# Patient Record
Sex: Female | Born: 1940
Health system: Southern US, Community
[De-identification: ages and names within clinical notes are randomized; demographics above are authoritative.]

## PROBLEM LIST (undated history)

## (undated) DIAGNOSIS — E7800 Pure hypercholesterolemia, unspecified: Secondary | ICD-10-CM

## (undated) DIAGNOSIS — M8580 Other specified disorders of bone density and structure, unspecified site: Secondary | ICD-10-CM

## (undated) DIAGNOSIS — T7840XA Allergy, unspecified, initial encounter: Secondary | ICD-10-CM

## (undated) DIAGNOSIS — I10 Essential (primary) hypertension: Secondary | ICD-10-CM

## (undated) DIAGNOSIS — M1990 Unspecified osteoarthritis, unspecified site: Secondary | ICD-10-CM

## (undated) DIAGNOSIS — J189 Pneumonia, unspecified organism: Secondary | ICD-10-CM

## (undated) HISTORY — DX: Essential (primary) hypertension: I10

## (undated) HISTORY — DX: Allergy, unspecified, initial encounter: T78.40XA

## (undated) HISTORY — DX: Other specified disorders of bone density and structure, unspecified site: M85.80

## (undated) HISTORY — PX: FRACTURE SURGERY: SHX138

---

## 1974-03-02 HISTORY — PX: ABDOMINAL HYSTERECTOMY: SHX81

## 1995-05-01 ENCOUNTER — Encounter (INDEPENDENT_AMBULATORY_CARE_PROVIDER_SITE_OTHER): Payer: Self-pay | Admitting: *Deleted

## 1995-05-01 LAB — CONVERTED CEMR LAB

## 1997-07-01 ENCOUNTER — Encounter: Admission: RE | Admit: 1997-07-01 | Discharge: 1997-07-01 | Payer: Self-pay | Admitting: Family Medicine

## 1997-12-01 ENCOUNTER — Encounter: Admission: RE | Admit: 1997-12-01 | Discharge: 1997-12-01 | Payer: Self-pay | Admitting: Family Medicine

## 1997-12-16 ENCOUNTER — Encounter: Admission: RE | Admit: 1997-12-16 | Discharge: 1997-12-16 | Payer: Self-pay | Admitting: Family Medicine

## 1998-01-19 ENCOUNTER — Encounter: Admission: RE | Admit: 1998-01-19 | Discharge: 1998-01-19 | Payer: Self-pay | Admitting: Sports Medicine

## 1998-04-13 ENCOUNTER — Encounter: Admission: RE | Admit: 1998-04-13 | Discharge: 1998-04-13 | Payer: Self-pay | Admitting: Sports Medicine

## 1998-08-19 ENCOUNTER — Encounter: Admission: RE | Admit: 1998-08-19 | Discharge: 1998-08-19 | Payer: Self-pay | Admitting: Family Medicine

## 1998-09-22 ENCOUNTER — Encounter: Admission: RE | Admit: 1998-09-22 | Discharge: 1998-09-22 | Payer: Self-pay | Admitting: Family Medicine

## 1999-01-20 ENCOUNTER — Encounter: Admission: RE | Admit: 1999-01-20 | Discharge: 1999-01-20 | Payer: Self-pay | Admitting: Sports Medicine

## 1999-02-02 ENCOUNTER — Encounter: Admission: RE | Admit: 1999-02-02 | Discharge: 1999-02-02 | Payer: Self-pay | Admitting: Family Medicine

## 1999-07-06 ENCOUNTER — Encounter: Admission: RE | Admit: 1999-07-06 | Discharge: 1999-07-06 | Payer: Self-pay | Admitting: Sports Medicine

## 1999-10-05 ENCOUNTER — Encounter: Admission: RE | Admit: 1999-10-05 | Discharge: 1999-10-05 | Payer: Self-pay | Admitting: Family Medicine

## 1999-12-07 ENCOUNTER — Encounter: Admission: RE | Admit: 1999-12-07 | Discharge: 1999-12-07 | Payer: Self-pay | Admitting: Family Medicine

## 2000-02-16 ENCOUNTER — Encounter: Admission: RE | Admit: 2000-02-16 | Discharge: 2000-02-16 | Payer: Self-pay | Admitting: Family Medicine

## 2000-06-06 ENCOUNTER — Encounter: Admission: RE | Admit: 2000-06-06 | Discharge: 2000-06-06 | Payer: Self-pay | Admitting: Family Medicine

## 2000-07-05 ENCOUNTER — Encounter: Admission: RE | Admit: 2000-07-05 | Discharge: 2000-07-05 | Payer: Self-pay | Admitting: Family Medicine

## 2000-08-09 ENCOUNTER — Encounter: Admission: RE | Admit: 2000-08-09 | Discharge: 2000-08-09 | Payer: Self-pay | Admitting: Family Medicine

## 2000-09-20 ENCOUNTER — Encounter: Admission: RE | Admit: 2000-09-20 | Discharge: 2000-09-20 | Payer: Self-pay | Admitting: Family Medicine

## 2000-10-05 ENCOUNTER — Encounter: Admission: RE | Admit: 2000-10-05 | Discharge: 2000-10-05 | Payer: Self-pay | Admitting: *Deleted

## 2000-10-05 ENCOUNTER — Encounter: Payer: Self-pay | Admitting: Sports Medicine

## 2000-10-29 ENCOUNTER — Encounter: Admission: RE | Admit: 2000-10-29 | Discharge: 2000-10-29 | Payer: Self-pay | Admitting: Family Medicine

## 2000-10-29 ENCOUNTER — Encounter: Payer: Self-pay | Admitting: Family Medicine

## 2000-11-01 ENCOUNTER — Encounter: Admission: RE | Admit: 2000-11-01 | Discharge: 2000-11-01 | Payer: Self-pay | Admitting: Family Medicine

## 2000-11-12 ENCOUNTER — Encounter: Admission: RE | Admit: 2000-11-12 | Discharge: 2000-11-12 | Payer: Self-pay | Admitting: Family Medicine

## 2002-09-17 ENCOUNTER — Ambulatory Visit (HOSPITAL_COMMUNITY): Admission: RE | Admit: 2002-09-17 | Discharge: 2002-09-17 | Payer: Self-pay | Admitting: Family Medicine

## 2003-10-06 ENCOUNTER — Ambulatory Visit: Payer: Self-pay | Admitting: *Deleted

## 2003-11-10 ENCOUNTER — Ambulatory Visit (HOSPITAL_COMMUNITY): Admission: RE | Admit: 2003-11-10 | Discharge: 2003-11-10 | Payer: Self-pay | Admitting: Family Medicine

## 2004-03-17 ENCOUNTER — Ambulatory Visit: Payer: Self-pay | Admitting: Family Medicine

## 2006-03-30 ENCOUNTER — Encounter (INDEPENDENT_AMBULATORY_CARE_PROVIDER_SITE_OTHER): Payer: Self-pay | Admitting: *Deleted

## 2006-11-24 ENCOUNTER — Emergency Department (HOSPITAL_COMMUNITY): Admission: EM | Admit: 2006-11-24 | Discharge: 2006-11-24 | Payer: Self-pay | Admitting: Family Medicine

## 2011-03-26 ENCOUNTER — Ambulatory Visit (INDEPENDENT_AMBULATORY_CARE_PROVIDER_SITE_OTHER): Payer: Medicare Other | Admitting: Family Medicine

## 2011-03-26 DIAGNOSIS — J45909 Unspecified asthma, uncomplicated: Secondary | ICD-10-CM | POA: Insufficient documentation

## 2011-03-26 DIAGNOSIS — I10 Essential (primary) hypertension: Secondary | ICD-10-CM | POA: Insufficient documentation

## 2011-03-26 DIAGNOSIS — E7800 Pure hypercholesterolemia, unspecified: Secondary | ICD-10-CM

## 2011-03-26 DIAGNOSIS — J309 Allergic rhinitis, unspecified: Secondary | ICD-10-CM

## 2011-03-26 MED ORDER — FLUTICASONE PROPIONATE 50 MCG/ACT NA SUSP: 2.0000 | Freq: Every day | NASAL | Status: DC

## 2011-03-26 NOTE — Progress Notes (Signed)
  Patient Name: Kathryn Gomez Date of Birth: Oct 13, 1940 Medical Record Number: [id number] Gender: female Date of Encounter: 03/26/2011  History of Present Illness:  Kathryn Gomez is a 71 y.o. very pleasant female patient who presents with the following:  She has noticed that her nose is "dripping," eyes running and sneezing.  Nasal drip is her main problem.  No cough.  Symptoms for about 2 days.  Last night she felt like she might have a fever but felt better today.  No GI symptoms.  NO headache. Using atrovent nasal once a day.  Is taking claritin regular right now.   Wonders if she could try claritin D  Patient Active Problem List  Diagnoses  . High cholesterol  . Hypertension  . Asthma   No past medical history on file. No past surgical history on file. History  Substance Use Topics  . Smoking status: Never Smoker   . Smokeless tobacco: Not on file  . Alcohol Use: Not on file   No family history on file. No Known Allergies  Medication list has been reviewed and updated.  Review of Systems: As per HPI- otherwise negative.  Physical Examination: Filed Vitals:   03/26/11 1440  BP: 120/78  Pulse: 72  Temp: 98.2 F (36.8 C)  TempSrc: Oral  Resp: 18  Height: 5' 4.25" (1.632 m)  Weight: 155 lb (70.308 kg)    Body mass index is 26.40 kg/(m^2).  GEN: WDWN, NAD, Non-toxic, A & O x 3 HEENT: Atraumatic, Normocephalic. Neck supple. No masses, No LAD.  Tm wnl bilaterally, oropharynx wnl.  PEERL Ears and Nose: No external deformity. Nasal cavity wnl CV: RRR, No M/G/R. No JVD. No thrill. No extra heart sounds. PULM: CTA B, no wheezes, crackles, rhonchi. No retractions. No resp. distress. No accessory muscle use. EXTR: No c/c/e NEURO Normal gait.  PSYCH: Normally interactive. Conversant. Not depressed or anxious appearing.  Calm demeanor.   Assessment and Plan: 1. Allergic rhinitis  fluticasone (FLONASE) 50 MCG/ACT nasal spray  2. High cholesterol    3. Hypertension    4.  Asthma     Suspect allergic rhinitis.  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  Let us know if not better in a few days!  If she wishes is likely ok to try claritin D for a few days- may raise her BP a bit but she is very well controlled so should not be a problem.

## 2011-04-20 ENCOUNTER — Other Ambulatory Visit: Payer: Self-pay | Admitting: Emergency Medicine

## 2011-05-04 ENCOUNTER — Ambulatory Visit (INDEPENDENT_AMBULATORY_CARE_PROVIDER_SITE_OTHER): Payer: Medicare Other | Admitting: Family Medicine

## 2011-05-04 ENCOUNTER — Encounter: Payer: Self-pay | Admitting: Family Medicine

## 2011-05-04 VITALS — BP 145/84 | HR 81 | Temp 98.3°F | Resp 16 | Ht 64.5 in | Wt 154.0 lb

## 2011-05-04 DIAGNOSIS — Z5189 Encounter for other specified aftercare: Secondary | ICD-10-CM

## 2011-05-04 DIAGNOSIS — I10 Essential (primary) hypertension: Secondary | ICD-10-CM

## 2011-05-04 DIAGNOSIS — Z79899 Other long term (current) drug therapy: Secondary | ICD-10-CM

## 2011-05-04 MED ORDER — LISINOPRIL-HYDROCHLOROTHIAZIDE 20-12.5 MG PO TABS: 1.0000 | ORAL_TABLET | Freq: Every day | ORAL | Status: DC

## 2011-05-04 MED ORDER — AMLODIPINE BESYLATE 5 MG PO TABS: 5.0000 mg | ORAL_TABLET | Freq: Every day | ORAL | Status: DC

## 2011-05-04 MED ORDER — SIMVASTATIN 20 MG PO TABS: 20.0000 mg | ORAL_TABLET | Freq: Every evening | ORAL | Status: DC

## 2011-05-04 NOTE — Progress Notes (Signed)
  Subjective:    Patient ID: San Jetty, female    DOB: 1940-10-09, 71 y.o.   MRN: [id number]  HPI  This 71 y.o female has HTN and Hyperlipidemia and needs medication refills. She denies any  side effects with the medications, She is considering cataract surgery in the near future and then plans to  return for complete physical exam. She would like labs done st that time.    Review of Systems Noncontributory     Objective:   Physical Exam  Vitals reviewed. Constitutional: She is oriented to person, place, and time. She appears well-developed and well-nourished. No distress.  HENT:  Head: Normocephalic and atraumatic.  Eyes: No scleral icterus.  Neck: Normal range of motion.  Cardiovascular: Normal rate and regular rhythm.   Pulmonary/Chest: Effort normal. No respiratory distress.  Neurological: She is alert and oriented to person, place, and time. No cranial nerve deficit. Coordination normal.  Skin: Skin is warm and dry.          Assessment & Plan:   1. HTN (hypertension)     Stable; readings in the past DBP= 110-140  2. Medication management     Continue current medications with refills x 1 year  RTC in 2 months for CPE

## 2011-05-04 NOTE — Patient Instructions (Signed)

## 2011-05-06 ENCOUNTER — Other Ambulatory Visit: Payer: Self-pay | Admitting: Family Medicine

## 2011-05-07 ENCOUNTER — Ambulatory Visit (INDEPENDENT_AMBULATORY_CARE_PROVIDER_SITE_OTHER): Payer: Medicare Other | Admitting: Family Medicine

## 2011-05-07 VITALS — BP 135/68 | HR 78 | Temp 98.4°F | Resp 18 | Ht 64.25 in | Wt 154.0 lb

## 2011-05-07 DIAGNOSIS — M545 Low back pain, unspecified: Secondary | ICD-10-CM

## 2011-05-07 MED ORDER — CYCLOBENZAPRINE HCL 5 MG PO TABS: ORAL_TABLET | ORAL | Status: DC

## 2011-05-07 MED ORDER — NAPROXEN 500 MG PO TABS: 500.0000 mg | ORAL_TABLET | Freq: Two times a day (BID) | ORAL | Status: AC | PRN

## 2011-05-07 NOTE — Progress Notes (Signed)
  Subjective:    Patient ID: Kathryn Gomez, female    DOB: [birth year], 71 y.o.   MRN: [id number]  HPI 71 yo female with back pain since Friday.  Woke up with it.  Bilateral back pain.  No radiation.  NO weakness.  Hurts to stand up, hurts to bend over.  Does do a lot of bending and carrying at work (works at [employer]).  No problems with back like this before.  No dysuria or frequency.     Review of Systems Negative except as per HPI     Objective:   Physical Exam  Constitutional: Vital signs are normal. She appears well-developed and well-nourished. She is active.  Non-toxic appearance. She does not appear ill.  Cardiovascular: Normal rate, regular rhythm, normal heart sounds and normal pulses.   Pulmonary/Chest: Effort normal and breath sounds normal.  Musculoskeletal:       Lumbar back: She exhibits decreased range of motion, tenderness and spasm. She exhibits no bony tenderness, no swelling and no deformity.  Neurological: She is alert. She has normal strength. Gait normal.  Reflex Scores:      Patellar reflexes are 2+ on the right side and 2+ on the left side.      Achilles reflexes are 2+ on the right side and 2+ on the left side.         Assessment & Plan:  LBP - no red flags other than age.  Likely strain/spasm.  Flexeril 5, though start with only 1/2 tab given age.  Naproxen.  Heat.  Walk but no heavy lifting.  OOW tomorrow.

## 2011-07-06 ENCOUNTER — Encounter: Payer: Medicare Other | Admitting: Family Medicine

## 2011-08-10 ENCOUNTER — Encounter: Payer: Self-pay | Admitting: Family Medicine

## 2011-08-10 ENCOUNTER — Ambulatory Visit (INDEPENDENT_AMBULATORY_CARE_PROVIDER_SITE_OTHER): Payer: Medicare Other | Admitting: Family Medicine

## 2011-08-10 VITALS — BP 178/84 | HR 61 | Temp 97.2°F | Resp 16 | Ht 65.0 in | Wt 153.0 lb

## 2011-08-10 DIAGNOSIS — I10 Essential (primary) hypertension: Secondary | ICD-10-CM

## 2011-08-10 DIAGNOSIS — Z0000 Encounter for general adult medical examination without abnormal findings: Secondary | ICD-10-CM

## 2011-08-10 DIAGNOSIS — Z13220 Encounter for screening for lipoid disorders: Secondary | ICD-10-CM

## 2011-08-10 LAB — POCT URINALYSIS DIPSTICK
Bilirubin, UA: NEGATIVE
Nitrite, UA: NEGATIVE
Protein, UA: NEGATIVE
pH, UA: 5.5

## 2011-08-10 LAB — POCT UA - MICROSCOPIC ONLY: Yeast, UA: NEGATIVE

## 2011-08-10 LAB — LIPID PANEL
Total CHOL/HDL Ratio: 3.2 Ratio
VLDL: 23 mg/dL (ref 0–40)

## 2011-08-10 LAB — COMPREHENSIVE METABOLIC PANEL
ALT: 14 U/L (ref 0–35)
AST: 22 U/L (ref 0–37)
Chloride: 105 mEq/L (ref 96–112)
Creat: 1.13 mg/dL — ABNORMAL HIGH (ref 0.50–1.10)
Sodium: 140 mEq/L (ref 135–145)
Total Bilirubin: 0.3 mg/dL (ref 0.3–1.2)
Total Protein: 7.7 g/dL (ref 6.0–8.3)

## 2011-08-10 LAB — IFOBT (OCCULT BLOOD): IFOBT: NEGATIVE

## 2011-08-10 MED ORDER — LISINOPRIL-HYDROCHLOROTHIAZIDE 20-12.5 MG PO TABS: 2.0000 | ORAL_TABLET | Freq: Every day | ORAL | Status: DC

## 2011-08-10 MED ORDER — BENZONATATE 100 MG PO CAPS: ORAL_CAPSULE | ORAL | Status: DC

## 2011-08-10 MED ORDER — IPRATROPIUM BROMIDE 0.03 % NA SOLN: 2.0000 | Freq: Two times a day (BID) | NASAL | Status: DC

## 2011-08-10 NOTE — Progress Notes (Addendum)
Subjective:    Patient ID: Kathryn Gomez, female    DOB: March 29, 1940, 71 y.o.   MRN: [id number]  HPI  This 71 y.o. AA female is here for Annual Subsequent [facility] Wellness visit. She has no complaints  except occasional cough related to humidity in the workplace (works at [employer] for many years).  She notes that her BP medication (Lisinopril- HCTZ 20-12.5 mg) used to be 2 tablets daily but when she picked  up a recent refill, label read "1 tablet daily". She is compliant with all medications and denies any side effects.  She denies: HA, scotomata (does have cataracts), CP, palpitations, edema, SOB, dizziness, lightheadedness,  numbness or weakness. She is a former smoker, does not consume alcohol and does not exercise.  She does monthly BSE but declines mammogram. She states she "will live to be 89".   Last Colorectal screening: Flexible Sigmoidoscopy at [facility] < 10 years ago- normal (per pt hx)  Review of Systems  Constitutional: Negative.   HENT: Negative.   Eyes: Negative.   Respiratory: Positive for cough.   Cardiovascular: Negative.   Gastrointestinal: Negative.   Genitourinary: Negative.   Musculoskeletal: Negative.   Skin: Negative.   Neurological: Negative.   Hematological: Negative.   Psychiatric/Behavioral: Negative.        Objective:   Physical Exam  Nursing note and vitals reviewed. Constitutional: She is oriented to person, place, and time. She appears well-developed and well-nourished. No distress.  HENT:  Head: Normocephalic and atraumatic.  Right Ear: Hearing, tympanic membrane, external ear and ear canal normal.  Left Ear: Hearing, tympanic membrane, external ear and ear canal normal.  Nose: Nose normal.  Mouth/Throat: Uvula is midline, oropharynx is clear and moist and mucous membranes are normal. No oropharyngeal exudate.       Dentition: upper partial plate  Eyes: Conjunctivae, EOM and lids are normal. Pupils are equal, round, and reactive  to light. No scleral icterus.  Fundoscopic exam:      The right eye shows red reflex.      The left eye shows red reflex.      Difficult to assess fundi due to cataract formation  Neck: Normal range of motion. Neck supple. No JVD present. No thyromegaly present.  Cardiovascular: Normal rate, regular rhythm, normal heart sounds and intact distal pulses.  Exam reveals no gallop and no friction rub.   No murmur heard. Pulmonary/Chest: Effort normal and breath sounds normal. No respiratory distress. She has no wheezes.  Abdominal: Soft. Bowel sounds are normal. She exhibits distension. She exhibits no mass. There is no tenderness. There is no guarding.       No organomegaly  Genitourinary: Rectal exam shows external hemorrhoid and anal tone abnormal. Rectal exam shows no internal hemorrhoid, no fissure, no mass and no tenderness. No breast swelling, tenderness, discharge or bleeding.       No vaginal /pelvic exam done per pt preference  Musculoskeletal: Normal range of motion. She exhibits no edema and no tenderness.  Lymphadenopathy:    She has no cervical adenopathy.  Neurological: She is alert and oriented to person, place, and time. She has normal reflexes. No cranial nerve deficit. She exhibits normal muscle tone. Coordination normal.  Skin: Skin is warm and dry. No rash noted. No erythema.       Lower ext varicosities  Psychiatric: She has a normal mood and affect. Her behavior is normal. Judgment and thought content normal.    Results for orders placed in  visit on 08/10/11  POCT UA - MICROSCOPIC ONLY      Component Value Range   WBC, Ur, HPF, POC 0-5     RBC, urine, microscopic 0-2     Bacteria, U Microscopic neg     Mucus, UA trace     Epithelial cells, urine per micros 0-9     Crystals, Ur, HPF, POC neg     Casts, Ur, LPF, POC neg     Yeast, UA neg    POCT URINALYSIS DIPSTICK      Component Value Range   Color, UA yellow     Clarity, UA clear     Glucose, UA neg      Bilirubin, UA neg     Ketones, UA neg     Spec Grav, UA 1.015     Blood, UA neg     pH, UA 5.5     Protein, UA neg     Urobilinogen, UA 0.2     Nitrite, UA neg     Leukocytes, UA small (1+)    IFOBT (OCCULT BLOOD)      Component Value Range   IFOBT Negative       BECK'S DEPRESSION SCALE: score= 2      Assessment & Plan:   1. Routine general medical examination at a health care facility  Advised Pneumovax but pt declined  2. HTN (hypertension)  Comprehensive metabolic panel, TSH RF: Lisinopril- HCTZ 20-12.5 mg  2 tablets daily Continue Amlodipine 5 mg  1 tablet daily  3. Screening for hyperlipidemia  Lipid panel (pt had breakfast this AM) Continue Simvastatin 20 mg  1 tablet hs

## 2011-08-10 NOTE — Patient Instructions (Signed)
Keeping You Healthy  Get These Tests  Blood Pressure- Have your blood pressure checked by your healthcare provider at least once a year.  Normal blood pressure is 120/80.  Weight- Have your body mass index (BMI) calculated to screen for obesity.  BMI is a measure of body fat based on height and weight.  You can calculate your own BMI at https://www.west-esparza.com/  Cholesterol- Have your cholesterol checked every year.  Diabetes- Have your blood sugar checked every year if you have high blood pressure, high cholesterol, a family history of diabetes or if you are overweight.  Pap Smear- Have a pap smear every 1 to 3 years if you have been sexually active.  If you are older than 65 and recent pap smears have been normal you may not need additional pap smears.  In addition, if you have had a hysterectomy  For benign disease additional pap smears are not necessary.  Mammogram-Yearly mammograms are essential for early detection of breast cancer  Screening for Colon Cancer- Colonoscopy starting at age 18. Screening may begin sooner depending on your family history and other health conditions.  Follow up colonoscopy as directed by your Gastroenterologist.  Screening for Osteoporosis- Screening begins at age 43 with bone density scanning, sooner if you are at higher risk for developing Osteoporosis.  Get these medicines  Calcium with Vitamin D- Your body requires 1200-1500 mg of Calcium a day and [phone] IU of Vitamin D a day.  You can only absorb 500 mg of Calcium at a time therefore Calcium must be taken in 2 or 3 separate doses throughout the day.  Hormones- Hormone therapy has been associated with increased risk for certain cancers and heart disease.  Talk to your healthcare provider about if you need relief from menopausal symptoms.  Aspirin- Ask your healthcare provider about taking Aspirin to prevent Heart Disease and Stroke.  Get these Immuniztions  Flu shot- Every fall  Pneumonia  shot- Once after the age of 13; if you are younger ask your healthcare provider if you need a pneumonia shot.  This vaccine is recommended; you declined to have it today.  Tetanus- Every ten years.  Zostavax- Once after the age of 47 to prevent shingles.  Take these steps  Don't smoke- Your healthcare provider can help you quit. For tips on how to quit, ask your healthcare provider or go to www.smokefree.gov or call 1-800 QUIT-NOW.  Be physically active- Exercise 5 days a week for a minimum of 30 minutes.  If you are not already physically active, start slow and gradually work up to 30 minutes of moderate physical activity.  Try walking, dancing, bike riding, swimming, etc.  Eat a healthy diet- Eat a variety of healthy foods such as fruits, vegetables, whole grains, low fat milk, low fat cheeses, yogurt, lean meats, chicken, fish, eggs, dried beans, tofu, etc.  For more information go to www.thenutritionsource.org  Dental visit- Brush and floss teeth twice daily; visit your dentist twice a year.  Eye exam- Visit your Optometrist or Ophthalmologist yearly.  Drink alcohol in moderation- Limit alcohol intake to one drink or less a day.  Never drink and drive.  Depression- Your emotional health is as important as your physical health.  If you're feeling down or losing interest in things you normally enjoy, please talk to your healthcare provider.  Seat Belts- can save your life; always wear one  Smoke/Carbon Monoxide detectors- These detectors need to be installed on the appropriate level of your home.  Replace batteries at least once a year.  Violence- If anyone is threatening or hurting you, please tell your healthcare provider. Living Will/ Health care power of attorney- Discuss with your healthcare provider and family    .Hypertension As your heart beats, it forces blood through your arteries. This force is your blood pressure. If the pressure is too high, it is called hypertension  (HTN) or high blood pressure. HTN is dangerous because you may have it and not know it. High blood pressure may mean that your heart has to work harder to pump blood. Your arteries may be narrow or stiff. The extra work puts you at risk for heart disease, stroke, and other problems.  Blood pressure consists of two numbers, a higher number over a lower, 110/72, for example. It is stated as "110 over 72." The ideal is below 120 for the top number (systolic) and under 80 for the bottom (diastolic). Write down your blood pressure today. You should pay close attention to your blood pressure if you have certain conditions such as:  Heart failure.   Prior heart attack.   Diabetes   Chronic kidney disease.   Prior stroke.   Multiple risk factors for heart disease.  To see if you have HTN, your blood pressure should be measured while you are seated with your arm held at the level of the heart. It should be measured at least twice. A one-time elevated blood pressure reading (especially in the Emergency Department) does not mean that you need treatment. There may be conditions in which the blood pressure is different between your right and left arms. It is important to see your caregiver soon for a recheck. Most people have essential hypertension which means that there is not a specific cause. This type of high blood pressure may be lowered by changing lifestyle factors such as:  Stress.   Smoking.   Lack of exercise.   Excessive weight.   Drug/tobacco/alcohol use.   Eating less salt.  Most people do not have symptoms from high blood pressure until it has caused damage to the body. Effective treatment can often prevent, delay or reduce that damage. TREATMENT  When a cause has been identified, treatment for high blood pressure is directed at the cause. There are a large number of medications to treat HTN. These fall into several categories, and your caregiver will help you select the medicines  that are best for you. Medications may have side effects. You should review side effects with your caregiver. If your blood pressure stays high after you have made lifestyle changes or started on medicines,   Your medication(s) may need to be changed.   Other problems may need to be addressed.   Be certain you understand your prescriptions, and know how and when to take your medicine.   Be sure to follow up with your caregiver within the time frame advised (usually within two weeks) to have your blood pressure rechecked and to review your medications.   If you are taking more than one medicine to lower your blood pressure, make sure you know how and at what times they should be taken. Taking two medicines at the same time can result in blood pressure that is too low.  SEEK IMMEDIATE MEDICAL CARE IF:  You develop a severe headache, blurred or changing vision, or confusion.   You have unusual weakness or numbness, or a faint feeling.   You have severe chest or abdominal pain, vomiting, or breathing problems.  MAKE SURE YOU:   Understand these instructions.   Will watch your condition.   Will get help right away if you are not doing well or get worse.  Document Released: 01/16/2005 Document Revised: 01/05/2011 Document Reviewed: 09/06/2007 [facility] Patient Information 2012 [place], [place].

## 2011-08-11 LAB — TSH: TSH: 1.707 u[IU]/mL (ref 0.350–4.500)

## 2011-08-11 NOTE — Progress Notes (Signed)
Quick Note:  Please notify pt that results are normal.   Provide pt with copy of labs. ______ 

## 2011-11-29 ENCOUNTER — Ambulatory Visit (INDEPENDENT_AMBULATORY_CARE_PROVIDER_SITE_OTHER): Payer: Medicare Other | Admitting: *Deleted

## 2011-11-29 DIAGNOSIS — Z23 Encounter for immunization: Secondary | ICD-10-CM

## 2012-02-15 ENCOUNTER — Ambulatory Visit (INDEPENDENT_AMBULATORY_CARE_PROVIDER_SITE_OTHER): Payer: Medicare Other | Admitting: Family Medicine

## 2012-02-15 ENCOUNTER — Encounter: Payer: Self-pay | Admitting: Family Medicine

## 2012-02-15 VITALS — BP 135/74 | HR 74 | Temp 98.3°F | Resp 18 | Ht 65.0 in | Wt 153.0 lb

## 2012-02-15 DIAGNOSIS — Z760 Encounter for issue of repeat prescription: Secondary | ICD-10-CM

## 2012-02-15 DIAGNOSIS — Z1211 Encounter for screening for malignant neoplasm of colon: Secondary | ICD-10-CM

## 2012-02-15 DIAGNOSIS — I10 Essential (primary) hypertension: Secondary | ICD-10-CM

## 2012-02-15 DIAGNOSIS — R350 Frequency of micturition: Secondary | ICD-10-CM

## 2012-02-15 LAB — POCT URINALYSIS DIPSTICK
Blood, UA: NEGATIVE
Glucose, UA: NEGATIVE
Nitrite, UA: NEGATIVE
Protein, UA: NEGATIVE
Spec Grav, UA: 1.025
Urobilinogen, UA: 0.2

## 2012-02-15 MED ORDER — BECLOMETHASONE DIPROPIONATE 80 MCG/ACT IN AERS: 2.0000 | INHALATION_SPRAY | RESPIRATORY_TRACT | Status: DC | PRN

## 2012-02-15 MED ORDER — SIMVASTATIN 20 MG PO TABS: 20.0000 mg | ORAL_TABLET | Freq: Every evening | ORAL | Status: DC

## 2012-02-15 MED ORDER — LISINOPRIL-HYDROCHLOROTHIAZIDE 20-12.5 MG PO TABS: 2.0000 | ORAL_TABLET | Freq: Every day | ORAL | Status: DC

## 2012-02-15 MED ORDER — AMLODIPINE BESYLATE 5 MG PO TABS: 5.0000 mg | ORAL_TABLET | Freq: Every day | ORAL | Status: DC

## 2012-02-15 NOTE — Patient Instructions (Addendum)
A member of our staff will contact you about your appointment with the GI specialist; that doctor will discuss the colonoscopy with you and his office will actually schedule the procedure.

## 2012-02-15 NOTE — Progress Notes (Signed)
S:  This 72 y.o. Female has HTN which is well -controlled on current medications. She c/o nocturia and frequency for several months. Pt denies fever/chills, dysuria, hematuria, abd/pelvic or flank pain. She denies fatigue, CP or tightness, palpitations, SOB or DOE, HA, dizziness, numbness, weakness or syncope. She has occasional edema due to fact that she works on her feet for hours.   Pt is s/p TAH about 20 years ago.  ROS: As per HPI.  O:  Filed Vitals:   02/15/12 0858  BP: 135/74  Pulse: 74  Temp: 98.3 F (36.8 C)  Resp: 18   GEN: In NAD; WN,WD. HENT: [place]/AT; EOMI w/ clear conj/sclerae. Oroph moict and clear. NECK: Supple w/o LAN or TMG. SKIN: W&D. BACK: No CVAT. ABD: Normal appearance, soft w/ decreased BS; no masses or HSM. NEURO: A&O x 3; CNs intact. Nonfocal.  Results for orders placed in visit on 02/15/12  POCT URINALYSIS DIPSTICK      Component Value Range   Color, UA yellow     Clarity, UA clear     Glucose, UA neg     Bilirubin, UA neg     Ketones, UA neg     Spec Grav, UA 1.025     Blood, UA neg     pH, UA 5.5     Protein, UA neg     Urobilinogen, UA 0.2     Nitrite, UA neg     Leukocytes, UA small (1+)      A/P: 1. Urinary frequency  Pt does not want to try any medication since it may not be covered by her insurer. Her employer allows restroom breaks so she will tolerate symptom for now. Consider Urology eval if symptoms worsen.  2. HTN (hypertension) - well-controlled RF: Amlodipine 5 mg and Lisinopril-HCTZ 20-12.5 mg  3. Medication refill  Medications refilled x 12 months at last annual visit in July 2013 but bottles are showing < 6 RFs; all chronic medications refilled again x 12 months (except MDI which pt only uses prn).  4. Screening for colorectal cancer - more than 10 years since last CRS  Ambulatory referral to Gastroenterology  Re: Immunizations- pt declines Pneumovax due to cost.

## 2012-02-29 ENCOUNTER — Ambulatory Visit (INDEPENDENT_AMBULATORY_CARE_PROVIDER_SITE_OTHER): Payer: Medicare Other | Admitting: Emergency Medicine

## 2012-02-29 VITALS — BP 140/82 | HR 76 | Temp 98.7°F | Resp 16 | Ht 65.0 in | Wt 153.0 lb

## 2012-02-29 DIAGNOSIS — J209 Acute bronchitis, unspecified: Secondary | ICD-10-CM

## 2012-02-29 MED ORDER — AZITHROMYCIN 250 MG PO TABS: ORAL_TABLET | ORAL | Status: DC

## 2012-02-29 MED ORDER — HYDROCOD POLST-CHLORPHEN POLST 10-8 MG/5ML PO LQCR: 5.0000 mL | Freq: Two times a day (BID) | ORAL | Status: DC | PRN

## 2012-02-29 NOTE — Patient Instructions (Addendum)

## 2012-02-29 NOTE — Progress Notes (Signed)
[facility] [address] [place] [address] [phone]  Date:  02/29/2012   Name:  Kathryn Gomez   DOB:  1940/10/30   MRN:  [id number]  PCP:  No primary provider on file.    Chief Complaint: Cough and Eyes watering   History of Present Illness:  Kathryn Gomez is a 72 y.o. very pleasant female patient who presents with the following:  Ill since Sunday night with a cough.  No wheezing or shortness of breath.  No fever or chills.  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  No nausea or vomiting.  No stool change.  No malaise or myalgias.  No improvement with OTC medications.  Patient Active Problem List  Diagnosis  . High cholesterol  . Hypertension  . Asthma    Past Medical History  Diagnosis Date  . Asthma   . Allergy   . Cataract   . Hypertension     Past Surgical History  Procedure Date  . Abdominal hysterectomy   . Colon surgery     this is an error, see note on hysterectomy    History  Substance Use Topics  . Smoking status: Former Games developer  . Smokeless tobacco: Not on file  . Alcohol Use: No    Family History  Problem Relation Age of Onset  . Hypertension Mother   . Hypertension Maternal Grandmother   . Hypertension Brother   . Obesity Brother     No Known Allergies  Medication list has been reviewed and updated.  Current Outpatient Prescriptions on File Prior to Visit  Medication Sig Dispense Refill  . amLODipine (NORVASC) 5 MG tablet Take 1 tablet (5 mg total) by mouth daily.  30 tablet  11  . aspirin 81 MG tablet Take 81 mg by mouth daily.      . beclomethasone (QVAR) 80 MCG/ACT inhaler Inhale 2 puffs into the lungs as needed.  1 Inhaler  3  . benzonatate (TESSALON) 100 MG capsule Take 1 capsule daily or bid prn cough.  30 capsule  2  . fluticasone (FLONASE) 50 MCG/ACT nasal spray Place 2 sprays into the nose daily.  16 g  6  . ipratropium (ATROVENT) 0.03 % nasal spray Place 2 sprays into the nose every 12 (twelve)  hours.  30 mL  5  . lisinopril-hydrochlorothiazide (PRINZIDE,ZESTORETIC) 20-12.5 MG per tablet Take 2 tablets by mouth daily.  60 tablet  11  . naproxen (NAPROSYN) 500 MG tablet Take 1 tablet (500 mg total) by mouth 2 (two) times daily as needed.  30 tablet  1  . simvastatin (ZOCOR) 20 MG tablet Take 1 tablet (20 mg total) by mouth every evening.  30 tablet  11  . cyclobenzaprine (FLEXERIL) 5 MG tablet 1/2 to 1 tab po BID for pain  30 tablet  0    Review of Systems:  As per HPI, otherwise negative.    Physical Examination: Filed Vitals:   02/29/12 1146  BP: 140/82  Pulse: 76  Temp: 98.7 F (37.1 C)  Resp: 16   Filed Vitals:   02/29/12 1146  Height: 5\' 5"  (1.651 m)  Weight: 153 lb (69.4 kg)   Body mass index is 25.46 kg/(m^2). Ideal Body Weight: Weight in (lb) to have BMI = 25: 149.9   GEN: WDWN, NAD, Non-toxic, A & O x 3 HEENT: Atraumatic, Normocephalic. Neck supple. No masses, No LAD. Ears and Nose: No external deformity. CV: RRR, No M/G/R. No JVD. No thrill.  No extra heart sounds. PULM: CTA B, no wheezes, crackles, rhonchi. No retractions. No resp. distress. No accessory muscle use. ABD: S, NT, ND, +BS. No rebound. No HSM. EXTR: No c/c/e NEURO Normal gait.  PSYCH: Normally interactive. Conversant. Not depressed or anxious appearing.  Calm demeanor.    Assessment and Plan: Bronchitis zpak tussionex   Carmelina Dane, MD

## 2012-04-11 ENCOUNTER — Other Ambulatory Visit: Payer: Self-pay | Admitting: Family Medicine

## 2012-05-02 ENCOUNTER — Other Ambulatory Visit: Payer: Self-pay | Admitting: Family Medicine

## 2012-05-09 ENCOUNTER — Other Ambulatory Visit: Payer: Self-pay | Admitting: Family Medicine

## 2012-07-04 ENCOUNTER — Ambulatory Visit: Payer: Medicare Other

## 2012-07-04 ENCOUNTER — Ambulatory Visit (INDEPENDENT_AMBULATORY_CARE_PROVIDER_SITE_OTHER): Payer: Medicare Other | Admitting: Emergency Medicine

## 2012-07-04 VITALS — BP 128/88 | HR 60 | Temp 98.1°F | Resp 16 | Ht 65.0 in | Wt 151.4 lb

## 2012-07-04 DIAGNOSIS — M1610 Unilateral primary osteoarthritis, unspecified hip: Secondary | ICD-10-CM

## 2012-07-04 DIAGNOSIS — M25552 Pain in left hip: Secondary | ICD-10-CM

## 2012-07-04 DIAGNOSIS — M169 Osteoarthritis of hip, unspecified: Secondary | ICD-10-CM

## 2012-07-04 DIAGNOSIS — M25559 Pain in unspecified hip: Secondary | ICD-10-CM

## 2012-07-04 MED ORDER — MELOXICAM 15 MG PO TABS: 15.0000 mg | ORAL_TABLET | Freq: Every day | ORAL | Status: DC

## 2012-07-04 NOTE — Progress Notes (Signed)
[facility] [address], [place] [address] [phone]  Date:  07/04/2012   Name:  Kathryn Gomez   DOB:  [birth year]   MRN:  [id number]  PCP:  Dow Adolph, MD    Chief Complaint: Leg Pain   History of Present Illness:  Kathryn Gomez is a 72 y.o. very pleasant female patient who presents with the following:  Larey Seat on a flight of stairs 6 weeks ago or so and for one month or so has pain in her left hip that interferes with her walking and prolonged standing.  She denies any locking or clicking in the joint, back pain, numbness tingling or weakness in her left leg.  No improvement with over the counter medications or other home remedies. Denies other complaint or health concern today.   Patient Active Problem List   Diagnosis Date Noted  . High cholesterol 03/26/2011  . Hypertension 03/26/2011  . Asthma 03/26/2011    Past Medical History  Diagnosis Date  . Asthma   . Allergy   . Cataract   . Hypertension     Past Surgical History  Procedure Laterality Date  . Abdominal hysterectomy    . Colon surgery      this is an error, see note on hysterectomy    History  Substance Use Topics  . Smoking status: Former Games developer  . Smokeless tobacco: Not on file  . Alcohol Use: No    Family History  Problem Relation Age of Onset  . Hypertension Mother   . Hypertension Maternal Grandmother   . Hypertension Brother   . Obesity Brother     No Known Allergies  Medication list has been reviewed and updated.  Current Outpatient Prescriptions on File Prior to Visit  Medication Sig Dispense Refill  . amLODipine (NORVASC) 5 MG tablet Take 1 tablet (5 mg total) by mouth daily.  30 tablet  11  . aspirin 81 MG tablet Take 81 mg by mouth daily.      Marland Kitchen azithromycin (ZITHROMAX) 250 MG tablet Take 2 tabs PO x 1 dose, then 1 tab PO QD x 4 days  6 tablet  0  . beclomethasone (QVAR) 80 MCG/ACT inhaler Inhale 2 puffs into the lungs as needed.  1 Inhaler  3  .  benzonatate (TESSALON) 100 MG capsule Take 1 capsule daily or bid prn cough.  30 capsule  2  . chlorpheniramine-HYDROcodone (TUSSIONEX PENNKINETIC ER) 10-8 MG/5ML LQCR Take 5 mLs by mouth every 12 (twelve) hours as needed (cough).  60 mL  0  . cyclobenzaprine (FLEXERIL) 5 MG tablet 1/2 to 1 tab po BID for pain  30 tablet  0  . ipratropium (ATROVENT) 0.03 % nasal spray Place 2 sprays into the nose every 12 (twelve) hours.  30 mL  5  . lisinopril-hydrochlorothiazide (PRINZIDE,ZESTORETIC) 20-12.5 MG per tablet Take 2 tablets by mouth daily.  60 tablet  11  . simvastatin (ZOCOR) 20 MG tablet Take 1 tablet (20 mg total) by mouth every evening.  30 tablet  11  . fluticasone (FLONASE) 50 MCG/ACT nasal spray Place 2 sprays into the nose daily.  16 g  6   No current facility-administered medications on file prior to visit.    Review of Systems:  As per HPI, otherwise negative.    Physical Examination: Filed Vitals:   07/04/12 1148  BP: 128/88  Pulse: 60  Temp: 98.1 F (36.7 C)  Resp: 16   Filed Vitals:   07/04/12 1148  Height: 5\' 5"  (1.651 m)  Weight: 151 lb 6.4 oz (68.675 kg)   Body mass index is 25.19 kg/(m^2). Ideal Body Weight: Weight in (lb) to have BMI = 25: 149.9   GEN: WDWN, NAD, Non-toxic, Alert & Oriented x 3 HEENT: Atraumatic, Normocephalic.  Ears and Nose: No external deformity. EXTR: No clubbing/cyanosis/edema NEURO: Normal gait.  PSYCH: Normally interactive. Conversant. Not depressed or anxious appearing.  Calm demeanor.    Assessment and Plan: Hip arthritis mobic Ortho consultation  Signed,  Phillips Odor, MD   [facility] reading (PRIMARY) by  Dr. Dareen Piano.  DJD hip joint.

## 2012-07-08 ENCOUNTER — Other Ambulatory Visit: Payer: Self-pay | Admitting: Family Medicine

## 2012-08-03 ENCOUNTER — Other Ambulatory Visit: Payer: Self-pay | Admitting: Family Medicine

## 2012-08-15 ENCOUNTER — Ambulatory Visit (INDEPENDENT_AMBULATORY_CARE_PROVIDER_SITE_OTHER): Payer: Medicare Other | Admitting: Family Medicine

## 2012-08-15 ENCOUNTER — Encounter: Payer: Self-pay | Admitting: Family Medicine

## 2012-08-15 VITALS — BP 158/86 | HR 70 | Temp 98.2°F | Resp 16 | Ht 65.0 in | Wt 153.0 lb

## 2012-08-15 DIAGNOSIS — M25559 Pain in unspecified hip: Secondary | ICD-10-CM

## 2012-08-15 DIAGNOSIS — Z23 Encounter for immunization: Secondary | ICD-10-CM

## 2012-08-15 DIAGNOSIS — G8929 Other chronic pain: Secondary | ICD-10-CM

## 2012-08-15 DIAGNOSIS — E785 Hyperlipidemia, unspecified: Secondary | ICD-10-CM

## 2012-08-15 DIAGNOSIS — M25552 Pain in left hip: Secondary | ICD-10-CM

## 2012-08-15 DIAGNOSIS — Z0000 Encounter for general adult medical examination without abnormal findings: Secondary | ICD-10-CM

## 2012-08-15 DIAGNOSIS — I10 Essential (primary) hypertension: Secondary | ICD-10-CM

## 2012-08-15 LAB — COMPREHENSIVE METABOLIC PANEL
ALT: 10 U/L (ref 0–35)
AST: 14 U/L (ref 0–37)
Albumin: 3.7 g/dL (ref 3.5–5.2)
Alkaline Phosphatase: 77 U/L (ref 39–117)
Potassium: 4.1 mEq/L (ref 3.5–5.3)
Sodium: 140 mEq/L (ref 135–145)
Total Bilirubin: 0.3 mg/dL (ref 0.3–1.2)
Total Protein: 6.2 g/dL (ref 6.0–8.3)

## 2012-08-15 LAB — LIPID PANEL
HDL: 40 mg/dL (ref >39)
LDL Cholesterol: 78 mg/dL (ref 0–99)
Total CHOL/HDL Ratio: 3.4 Ratio

## 2012-08-15 MED ORDER — LISINOPRIL-HYDROCHLOROTHIAZIDE 20-12.5 MG PO TABS: 2.0000 | ORAL_TABLET | Freq: Every day | ORAL | Status: DC

## 2012-08-15 NOTE — Progress Notes (Signed)
Subjective:    Patient ID: Kathryn Gomez, female    DOB: [birth year], 72 y.o.   MRN: [id number]  HPI  This 72 y.o. AA female is here for [facility] Subsequent annual physical which serves as a pre-  operative clearance exam. She is scheduled for L hip replacement on September 03, 2012. She has  HTN and lipid disorder, controlled on current medications. She has pre-op labs scheduled at  the hospital on September 02, 2012. She has mild Asthma and uses MDIs "as needed". Pt reports  no recent flare of respiratory problems.   Pt continues to work in [employer]; she c/o R upper ext muscle pain at night. 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  She has no pain at this time.   HCM: MMG- current (last 2012- normal).            CRS- Feb 2014 (polyps- benign).            IMM- Needs Pneumococcal vaccine and Zostavax.   Patient Active Problem List   Diagnosis Date Noted  . High cholesterol 03/26/2011  . Hypertension 03/26/2011  . Asthma 03/26/2011   PMHx, Soc Hx and Fam Hx reviewed.   Review of Systems  Constitutional: Negative.   HENT: Negative.   Eyes: Negative.   Respiratory: Negative.   Cardiovascular: Negative.   Gastrointestinal: Negative.   Endocrine: Negative.   Genitourinary: Negative.   Musculoskeletal: Negative.   Skin: Negative.   Allergic/Immunologic: Negative.   Neurological: Negative.   Hematological: Negative.   Psychiatric/Behavioral: Negative.        Objective:   Physical Exam  Nursing note and vitals reviewed. Constitutional: She is oriented to person, place, and time. She appears well-developed and well-nourished. No distress.  HENT:  Head: Normocephalic and atraumatic.  Right Ear: Hearing, external ear and ear canal normal. Tympanic membrane is scarred.  Left Ear: Hearing, external ear and ear canal normal. Tympanic membrane is scarred.  Nose: Nose normal. No nasal deformity or septal deviation.  Mouth/Throat: Uvula is midline, oropharynx is clear and  moist and mucous membranes are normal. No oral lesions. Normal dentition.  Eyes: Conjunctivae, EOM and lids are normal. Pupils are equal, round, and reactive to light. No scleral icterus.  Neck: Normal range of motion and phonation normal. Neck supple. No JVD present. No spinous process tenderness and no muscular tenderness present. Carotid bruit is not present. No tracheal deviation and normal range of motion present. No mass and no thyromegaly present.  Cardiovascular: Normal rate, regular rhythm, normal heart sounds and intact distal pulses.  Exam reveals no gallop and no friction rub.   No murmur heard. Pulmonary/Chest: Effort normal and breath sounds normal. No respiratory distress. She has no wheezes. She has no rales. Right breast exhibits no inverted nipple, no mass, no nipple discharge, no skin change and no tenderness. Left breast exhibits no inverted nipple, no mass, no nipple discharge, no skin change and no tenderness. Breasts are symmetrical.  Abdominal: Soft. Normal appearance and bowel sounds are normal. She exhibits no distension, no abdominal bruit, no pulsatile midline mass and no mass. There is no hepatosplenomegaly. There is no tenderness. There is no guarding and no CVA tenderness. No hernia.  Musculoskeletal:       Left hip: She exhibits decreased range of motion, decreased strength, tenderness and bony tenderness. She exhibits no swelling, no crepitus and no deformity.       Right upper arm: Normal. She exhibits no tenderness, no bony tenderness,  no swelling and no deformity.  Lymphadenopathy:       Head (right side): No submental, no submandibular, no posterior auricular and no occipital adenopathy present.       Head (left side): No submental, no submandibular, no posterior auricular and no occipital adenopathy present.    She has no cervical adenopathy.    She has no axillary adenopathy.       Right: No inguinal and no supraclavicular adenopathy present.       Left: No  inguinal and no supraclavicular adenopathy present.  Neurological: She is alert and oriented to person, place, and time. No cranial nerve deficit. She exhibits normal muscle tone. Coordination normal.  Skin: Skin is warm and dry. No rash noted. No erythema. No pallor.  Psychiatric: She has a normal mood and affect. Her behavior is normal. Judgment and thought content normal.    Results for orders placed in visit on 02/15/12  POCT URINALYSIS DIPSTICK      Result Value Range   Color, UA yellow     Clarity, UA clear     Glucose, UA neg     Bilirubin, UA neg     Ketones, UA neg     Spec Grav, UA 1.025     Blood, UA neg     pH, UA 5.5     Protein, UA neg     Urobilinogen, UA 0.2     Nitrite, UA neg     Leukocytes, UA small (1+)      ECG: NSR; no ST-TW changes. No ectopy.     Assessment & Plan:  Routine general medical examination at a health care facility - Clearance for surgery. Plan: Comprehensive metabolic panel  HTN (hypertension) - Stable; continue current medications.  Pt to take BP medications the morning of surgery.   Plan: EKG 12-Lead, Lipid panel, Comprehensive metabolic panel  (Copy of ECG given to pt)  Other and unspecified hyperlipidemia- On Simvastatin; this is an evening medication. Can be resumed on the evening after procedure.  Chronic left hip pain- Left hip replacement scheduled for September 03, 2012.  Need for prophylactic vaccination against Streptococcus pneumoniae (pneumococcus) - Plan: Pneumococcal polysaccharide vaccine 23-valent greater than or equal to 2yo subcutaneous/IM

## 2012-08-15 NOTE — Patient Instructions (Signed)
Keeping You Healthy  Get These Tests  Blood Pressure- Have your blood pressure checked by your healthcare provider at least once a year.  Normal blood pressure is 120/80.  Weight- Have your body mass index (BMI) calculated to screen for obesity.  BMI is a measure of body fat based on height and weight.  You can calculate your own BMI at www.nhlbisupport.com/bmi/  Cholesterol- Have your cholesterol checked every year.  Diabetes- Have your blood sugar checked every year if you have high blood pressure, high cholesterol, a family history of diabetes or if you are overweight.  Pap Smear- Have a pap smear every 1 to 3 years if you have been sexually active.  If you are older than 65 and recent pap smears have been normal you may not need additional pap smears.  In addition, if you have had a hysterectomy  For benign disease additional pap smears are not necessary.  Mammogram-Yearly mammograms are essential for early detection of breast cancer  Screening for Colon Cancer- Colonoscopy starting at age 50. Screening may begin sooner depending on your family history and other health conditions.  Follow up colonoscopy as directed by your Gastroenterologist.  Screening for Osteoporosis- Screening begins at age 65 with bone density scanning, sooner if you are at higher risk for developing Osteoporosis.  Get these medicines  Calcium with Vitamin D- Your body requires 1200-1500 mg of Calcium a day and 800-1000 IU of Vitamin D a day.  You can only absorb 500 mg of Calcium at a time therefore Calcium must be taken in 2 or 3 separate doses throughout the day.  Hormones- Hormone therapy has been associated with increased risk for certain cancers and heart disease.  Talk to your healthcare provider about if you need relief from menopausal symptoms.  Aspirin- Ask your healthcare provider about taking Aspirin to prevent Heart Disease and Stroke.  Get these Immuniztions  Flu shot- Every fall  Pneumonia  shot- Once after the age of 65; if you are younger ask your healthcare provider if you need a pneumonia shot.  Tetanus- Every ten years.  Zostavax- Once after the age of 60 to prevent shingles.  Take these steps  Don't smoke- Your healthcare provider can help you quit. For tips on how to quit, ask your healthcare provider or go to www.smokefree.gov or call 1-800 QUIT-NOW.  Be physically active- Exercise 5 days a week for a minimum of 30 minutes.  If you are not already physically active, start slow and gradually work up to 30 minutes of moderate physical activity.  Try walking, dancing, bike riding, swimming, etc.  Eat a healthy diet- Eat a variety of healthy foods such as fruits, vegetables, whole grains, low fat milk, low fat cheeses, yogurt, lean meats, chicken, fish, eggs, dried beans, tofu, etc.  For more information go to www.thenutritionsource.org  Dental visit- Brush and floss teeth twice daily; visit your dentist twice a year.  Eye exam- Visit your Optometrist or Ophthalmologist yearly.  Drink alcohol in moderation- Limit alcohol intake to one drink or less a day.  Never drink and drive.  Depression- Your emotional health is as important as your physical health.  If you're feeling down or losing interest in things you normally enjoy, please talk to your healthcare provider.  Seat Belts- can save your life; always wear one  Smoke/Carbon Monoxide detectors- These detectors need to be installed on the appropriate level of your home.  Replace batteries at least once a year.  Violence- If anyone   is threatening or hurting you, please tell your healthcare provider.  Living Will/ Health care power of attorney- Discuss with your healthcare provider and family.   You are being cleared for right hip replacement surgery on September 03, 2012. I have ordered labs to day to look at electrolytes, blood sugar, kidney and liver tests.as well as lipid panel. You are having kidney function, blood  sugar and electrolyte tests repeated on the day prior to surgery. I want to see you in 4 months or sooner if needed. Follow all pre-op and post-op instructions for a successful surgery and recovery.

## 2012-08-16 NOTE — Progress Notes (Signed)
Quick Note:  Please notify pt that results are normal.   Provide pt with copy of labs. ______ 

## 2012-08-17 ENCOUNTER — Encounter: Payer: Self-pay | Admitting: *Deleted

## 2012-08-28 ENCOUNTER — Encounter (HOSPITAL_COMMUNITY): Payer: Self-pay | Admitting: Pharmacy Technician

## 2012-08-28 NOTE — Progress Notes (Signed)
Surgery clearance note 08/15/12 Dr. Dow Adolph on chart, EKG 08/15/12 on chart

## 2012-08-28 NOTE — Patient Instructions (Addendum)
20 Rocsi Hazelbaker  08/28/2012   Your procedure is scheduled on: 09/03/12  Report to [facility] at 8:00 AM.  Call this number if you have problems the morning of surgery [phone]: [phone]   Remember: please bring inhaler on day of surgery    Do not eat food or drink liquids After Midnight.     Take these medicines the morning of surgery with A SIP OF WATER: amlodipine   Do not wear jewelry, make-up or nail polish.  Do not wear lotions, powders, or perfumes. You may wear deodorant.  Do not shave 48 hours prior to surgery. Men may shave face and neck.  Do not bring valuables to the hospital.  Contacts, dentures or bridgework may not be worn into surgery.  Leave suitcase in the car. After surgery it may be brought to your room.  For patients admitted to the hospital, checkout time is 11:00 AM the day of discharge.    Please read over the following fact sheets that you were given: MRSA Information, blood fact sheet, incentive spirometry fact sheet Birdie Sons, RN  pre op nurse call if needed [phone]    FAILURE TO FOLLOW THESE INSTRUCTIONS MAY RESULT IN CANCELLATION OF YOUR SURGERY   Patient Signature: ___________________________________________

## 2012-08-29 ENCOUNTER — Ambulatory Visit (HOSPITAL_COMMUNITY)
Admission: RE | Admit: 2012-08-29 | Discharge: 2012-08-29 | Disposition: A | Discharge status: Home / Self Care | Payer: Medicare Other | Source: Ambulatory Visit | Attending: Orthopedic Surgery | Admitting: Orthopedic Surgery

## 2012-08-29 ENCOUNTER — Encounter (HOSPITAL_COMMUNITY)
Admission: RE | Admit: 2012-08-29 | Discharge: 2012-08-29 | Disposition: A | Discharge status: Home / Self Care | Payer: Medicare Other | Source: Ambulatory Visit | Attending: Orthopedic Surgery | Admitting: Orthopedic Surgery

## 2012-08-29 ENCOUNTER — Encounter (HOSPITAL_COMMUNITY): Payer: Self-pay

## 2012-08-29 DIAGNOSIS — Z01818 Encounter for other preprocedural examination: Secondary | ICD-10-CM | POA: Insufficient documentation

## 2012-08-29 DIAGNOSIS — M169 Osteoarthritis of hip, unspecified: Secondary | ICD-10-CM | POA: Insufficient documentation

## 2012-08-29 DIAGNOSIS — M1610 Unilateral primary osteoarthritis, unspecified hip: Secondary | ICD-10-CM | POA: Insufficient documentation

## 2012-08-29 HISTORY — DX: Unspecified osteoarthritis, unspecified site: M19.90

## 2012-08-29 HISTORY — DX: Pneumonia, unspecified organism: J18.9

## 2012-08-29 HISTORY — DX: Pure hypercholesterolemia, unspecified: E78.00

## 2012-08-29 LAB — URINALYSIS, ROUTINE W REFLEX MICROSCOPIC
Bilirubin Urine: NEGATIVE
Glucose, UA: NEGATIVE mg/dL
Nitrite: NEGATIVE
Specific Gravity, Urine: 1.02 (ref 1.005–1.030)
pH: 5.5 (ref 5.0–8.0)

## 2012-08-29 LAB — URINE MICROSCOPIC-ADD ON

## 2012-08-29 LAB — PROTIME-INR: INR: 1 (ref 0.00–1.49)

## 2012-08-29 LAB — BASIC METABOLIC PANEL
BUN: 29 mg/dL — ABNORMAL HIGH (ref 6–23)
CO2: 24 mEq/L (ref 19–32)
Chloride: 106 mEq/L (ref 96–112)
Creatinine, Ser: 1.23 mg/dL — ABNORMAL HIGH (ref 0.50–1.10)
GFR calc Af Amer: 50 mL/min — ABNORMAL LOW (ref >90)
Glucose, Bld: 112 mg/dL — ABNORMAL HIGH (ref 70–99)
Potassium: 4.3 mEq/L (ref 3.5–5.1)

## 2012-08-29 LAB — SURGICAL PCR SCREEN: Staphylococcus aureus: NEGATIVE

## 2012-08-29 LAB — CBC
HCT: 35.4 % — ABNORMAL LOW (ref 36.0–46.0)
Hemoglobin: 11.5 g/dL — ABNORMAL LOW (ref 12.0–15.0)
MCHC: 32.5 g/dL (ref 30.0–36.0)
MCV: 94.4 fL (ref 78.0–100.0)

## 2012-08-30 ENCOUNTER — Other Ambulatory Visit: Payer: Self-pay | Admitting: Family Medicine

## 2012-09-01 NOTE — H&P (Signed)
TOTAL HIP ADMISSION H&P  Patient is admitted for left total hip arthroplasty, anterior approach.  Subjective:  Chief Complaint: Left hip OA / pain  HPI:     Kathryn Gomez, 72 y.o. female, has a history of pain and functional disability in the left hip(s) due to arthritis and patient has failed non-surgical conservative treatments for greater than 12 weeks to include NSAID's and/or analgesics and activity modification. Onset of symptoms was gradual starting 1 years ago with rapidlly worsening course for about 6 months.The patient noted no past surgery on the left hip(s). Patient currently rates pain in the left hip at 7 out of 10 with activity. Patient has night pain, worsening of pain with activity and weight bearing, trendelenberg gait, pain that interfers with activities of daily living and pain with passive range of motion. Patient has evidence of periarticular osteophytes and joint space narrowing by imaging studies. This condition presents safety issues increasing the risk of falls. There is no current active signs of infection. Risks, benefits and expectations were discussed with the patient. Patient understand the risks, benefits and expectations and wishes to proceed with surgery.   D/C Plans: Home with HHPT   Post-op Meds: No Rx given   Tranexamic Acid: To be given   Decadron: To be given   FYI:  ASA post-op   Patient Active Problem List   Diagnosis Date Noted  . Chronic left hip pain 08/15/2012  . High cholesterol 03/26/2011  . Hypertension 03/26/2011  . Asthma 03/26/2011   Past Medical History  Diagnosis Date  . Asthma   . Allergy   . Cataract     bilateral  . Hypertension   . Hypercholesteremia   . Pneumonia     hx of  . Arthritis     Past Surgical History  Procedure Laterality Date  . Abdominal hysterectomy  Feb 1976    Pelvic pain (no cancer)  . Fracture surgery Right 20 years ago    two fingers    No prescriptions prior to admission   No Known Allergies    History  Substance Use Topics  . Smoking status: Never Smoker   . Smokeless tobacco: Never Used  . Alcohol Use: No    Family History  Problem Relation Age of Onset  . Hypertension Mother   . Hypertension Maternal Grandmother   . Hypertension Brother   . Obesity Brother      Review of Systems  Constitutional: Negative.   HENT: Negative.   Eyes: Negative.   Respiratory: Negative.   Cardiovascular: Negative.   Gastrointestinal: Negative.   Genitourinary: Positive for frequency.  Musculoskeletal: Positive for joint pain.  Skin: Negative.   Neurological: Negative.   Endo/Heme/Allergies: Negative.   Psychiatric/Behavioral: Negative.     Objective:  Physical Exam  Constitutional: She is oriented to person, place, and time. She appears well-developed and well-nourished.  HENT:  Head: Normocephalic and atraumatic.  Mouth/Throat: Oropharynx is clear and moist. She has dentures.  Eyes: Pupils are equal, round, and reactive to light.  Neck: Neck supple. No JVD present. No tracheal deviation present. No thyromegaly present.  Cardiovascular: Normal rate, regular rhythm, normal heart sounds and intact distal pulses.   Respiratory: Effort normal and breath sounds normal. No stridor. No respiratory distress. She has no wheezes.  GI: Soft. There is no tenderness. There is no guarding.  Musculoskeletal:       Left hip: She exhibits decreased range of motion, decreased strength, tenderness and bony tenderness. She exhibits no swelling,  no deformity and no laceration.  Lymphadenopathy:    She has no cervical adenopathy.  Neurological: She is alert and oriented to person, place, and time.  Skin: Skin is warm and dry.  Psychiatric: She has a normal mood and affect.     Labs:  Estimated body mass index is 25.19 kg/(m^2) as calculated from the following:   Height as of 07/04/12: 5\' 5"  (1.651 m).   Weight as of 07/04/12: 68.675 kg (151 lb 6.4 oz).   Imaging Review Plain radiographs  demonstrate severe degenerative joint disease of the left hip(s). The bone quality appears to be good for age and reported activity level.  Assessment/Plan:  End stage arthritis, left hip(s)  The patient history, physical examination, clinical judgement of the provider and imaging studies are consistent with end stage degenerative joint disease of the left hip(s) and total hip arthroplasty is deemed medically necessary. The treatment options including medical management, injection therapy, arthroscopy and arthroplasty were discussed at length. The risks and benefits of total hip arthroplasty were presented and reviewed. The risks due to aseptic loosening, infection, stiffness, dislocation/subluxation,  thromboembolic complications and other imponderables were discussed.  The patient acknowledged the explanation, agreed to proceed with the plan and consent was signed. Patient is being admitted for inpatient treatment for surgery, pain control, PT, OT, prophylactic antibiotics, VTE prophylaxis, progressive ambulation and ADL's and discharge planning.The patient is planning to be discharged home with home health services.    Kathryn Gomez   PAC  09/01/2012, 8:53 PM

## 2012-09-03 ENCOUNTER — Inpatient Hospital Stay (HOSPITAL_COMMUNITY): Payer: Medicare Other

## 2012-09-03 ENCOUNTER — Inpatient Hospital Stay (HOSPITAL_COMMUNITY)
Admission: RE | Admit: 2012-09-03 | Discharge: 2012-09-04 | DRG: 470 | Disposition: A | Discharge status: Home Health | Payer: Medicare Other | Source: Ambulatory Visit | Attending: Orthopedic Surgery | Admitting: Orthopedic Surgery

## 2012-09-03 ENCOUNTER — Encounter (HOSPITAL_COMMUNITY): Payer: Self-pay | Admitting: Certified Registered Nurse Anesthetist

## 2012-09-03 ENCOUNTER — Encounter (HOSPITAL_COMMUNITY)
Admission: RE | Disposition: A | Discharge status: Home Health | Payer: Self-pay | Source: Ambulatory Visit | Attending: Orthopedic Surgery

## 2012-09-03 ENCOUNTER — Encounter (HOSPITAL_COMMUNITY): Payer: Self-pay | Admitting: Anesthesiology

## 2012-09-03 ENCOUNTER — Inpatient Hospital Stay (HOSPITAL_COMMUNITY): Payer: Medicare Other | Admitting: Anesthesiology

## 2012-09-03 DIAGNOSIS — J45909 Unspecified asthma, uncomplicated: Secondary | ICD-10-CM | POA: Diagnosis present

## 2012-09-03 DIAGNOSIS — M169 Osteoarthritis of hip, unspecified: Principal | ICD-10-CM | POA: Diagnosis present

## 2012-09-03 DIAGNOSIS — E663 Overweight: Secondary | ICD-10-CM

## 2012-09-03 DIAGNOSIS — Z6825 Body mass index (BMI) 25.0-25.9, adult: Secondary | ICD-10-CM

## 2012-09-03 DIAGNOSIS — D500 Iron deficiency anemia secondary to blood loss (chronic): Secondary | ICD-10-CM | POA: Diagnosis not present

## 2012-09-03 DIAGNOSIS — E7800 Pure hypercholesterolemia, unspecified: Secondary | ICD-10-CM | POA: Diagnosis present

## 2012-09-03 DIAGNOSIS — Z96649 Presence of unspecified artificial hip joint: Secondary | ICD-10-CM

## 2012-09-03 DIAGNOSIS — I10 Essential (primary) hypertension: Secondary | ICD-10-CM | POA: Diagnosis present

## 2012-09-03 DIAGNOSIS — D62 Acute posthemorrhagic anemia: Secondary | ICD-10-CM | POA: Diagnosis not present

## 2012-09-03 DIAGNOSIS — M1610 Unilateral primary osteoarthritis, unspecified hip: Principal | ICD-10-CM | POA: Diagnosis present

## 2012-09-03 HISTORY — PX: TOTAL HIP ARTHROPLASTY: SHX124

## 2012-09-03 LAB — TYPE AND SCREEN: ABO/RH(D): O POS

## 2012-09-03 LAB — ABO/RH: ABO/RH(D): O POS

## 2012-09-03 SURGERY — ARTHROPLASTY, HIP, TOTAL, ANTERIOR APPROACH
Anesthesia: Spinal | Site: Hip | Laterality: Left | Wound class: Clean

## 2012-09-03 MED ORDER — METHOCARBAMOL 100 MG/ML IJ SOLN
500.0000 mg | Freq: Four times a day (QID) | INTRAVENOUS | Status: DC | PRN
  Filled 2012-09-03: qty 5

## 2012-09-03 MED ORDER — AMLODIPINE BESYLATE 5 MG PO TABS
5.0000 mg | ORAL_TABLET | Freq: Every morning | ORAL | Status: DC
  Administered 2012-09-04: 5 mg via ORAL
  Filled 2012-09-03: qty 1

## 2012-09-03 MED ORDER — CEFAZOLIN SODIUM-DEXTROSE 2-3 GM-% IV SOLR
2.0000 g | INTRAVENOUS | Status: AC
  Administered 2012-09-03: 2 g via INTRAVENOUS

## 2012-09-03 MED ORDER — PROMETHAZINE HCL 25 MG/ML IJ SOLN: 6.2500 mg | INTRAMUSCULAR | Status: DC | PRN

## 2012-09-03 MED ORDER — ONDANSETRON HCL 4 MG/2ML IJ SOLN: 4.0000 mg | Freq: Four times a day (QID) | INTRAMUSCULAR | Status: DC | PRN

## 2012-09-03 MED ORDER — LACTATED RINGERS IV SOLN
INTRAVENOUS | Status: DC | PRN
  Administered 2012-09-03 (×2): via INTRAVENOUS

## 2012-09-03 MED ORDER — LACTATED RINGERS IV SOLN
INTRAVENOUS | Status: DC
  Administered 2012-09-03: 1000 mL via INTRAVENOUS

## 2012-09-03 MED ORDER — DEXAMETHASONE SODIUM PHOSPHATE 10 MG/ML IJ SOLN
10.0000 mg | Freq: Once | INTRAMUSCULAR | Status: AC
  Administered 2012-09-04: 10 mg via INTRAVENOUS
  Filled 2012-09-03: qty 1

## 2012-09-03 MED ORDER — IPRATROPIUM BROMIDE 0.03 % NA SOLN
2.0000 | Freq: Two times a day (BID) | NASAL | Status: DC
  Filled 2012-09-03: qty 30

## 2012-09-03 MED ORDER — MENTHOL 3 MG MT LOZG: 1.0000 | LOZENGE | OROMUCOSAL | Status: DC | PRN

## 2012-09-03 MED ORDER — BUPIVACAINE HCL (PF) 0.5 % IJ SOLN
INTRAMUSCULAR | Status: DC | PRN
  Administered 2012-09-03: 3 mL

## 2012-09-03 MED ORDER — OXYCODONE HCL 5 MG PO TABS: 5.0000 mg | ORAL_TABLET | Freq: Once | ORAL | Status: DC | PRN

## 2012-09-03 MED ORDER — MEPERIDINE HCL 50 MG/ML IJ SOLN: 6.2500 mg | INTRAMUSCULAR | Status: DC | PRN

## 2012-09-03 MED ORDER — HYDROMORPHONE HCL PF 1 MG/ML IJ SOLN: 0.2500 mg | INTRAMUSCULAR | Status: DC | PRN

## 2012-09-03 MED ORDER — SENNA 8.6 MG PO TABS
1.0000 | ORAL_TABLET | Freq: Two times a day (BID) | ORAL | Status: DC
  Administered 2012-09-03 – 2012-09-04 (×2): 8.6 mg via ORAL
  Filled 2012-09-03 (×2): qty 1

## 2012-09-03 MED ORDER — PHENOL 1.4 % MT LIQD: 1.0000 | OROMUCOSAL | Status: DC | PRN

## 2012-09-03 MED ORDER — ONDANSETRON HCL 4 MG/2ML IJ SOLN
INTRAMUSCULAR | Status: DC | PRN
  Administered 2012-09-03: 4 mg via INTRAVENOUS

## 2012-09-03 MED ORDER — SIMVASTATIN 20 MG PO TABS
20.0000 mg | ORAL_TABLET | Freq: Every evening | ORAL | Status: DC
  Administered 2012-09-03: 20 mg via ORAL
  Filled 2012-09-03 (×2): qty 1

## 2012-09-03 MED ORDER — ALUMINUM HYDROXIDE GEL 320 MG/5ML PO SUSP
15.0000 mL | ORAL | Status: DC | PRN
  Filled 2012-09-03: qty 30

## 2012-09-03 MED ORDER — FERROUS SULFATE 325 (65 FE) MG PO TABS
325.0000 mg | ORAL_TABLET | Freq: Three times a day (TID) | ORAL | Status: DC
  Administered 2012-09-03 – 2012-09-04 (×2): 325 mg via ORAL
  Filled 2012-09-03 (×5): qty 1

## 2012-09-03 MED ORDER — DEXAMETHASONE SODIUM PHOSPHATE 10 MG/ML IJ SOLN
10.0000 mg | Freq: Once | INTRAMUSCULAR | Status: AC
  Administered 2012-09-03: 10 mg via INTRAVENOUS

## 2012-09-03 MED ORDER — METHOCARBAMOL 500 MG PO TABS: 500.0000 mg | ORAL_TABLET | Freq: Four times a day (QID) | ORAL | Status: DC | PRN

## 2012-09-03 MED ORDER — FLUTICASONE PROPIONATE HFA 44 MCG/ACT IN AERO
1.0000 | INHALATION_SPRAY | Freq: Two times a day (BID) | RESPIRATORY_TRACT | Status: DC
  Administered 2012-09-03 – 2012-09-04 (×2): 1 via RESPIRATORY_TRACT
  Filled 2012-09-03: qty 10.6

## 2012-09-03 MED ORDER — OXYCODONE HCL 5 MG/5ML PO SOLN
5.0000 mg | Freq: Once | ORAL | Status: DC | PRN
  Filled 2012-09-03: qty 5

## 2012-09-03 MED ORDER — HYDROCODONE-ACETAMINOPHEN 7.5-325 MG PO TABS
1.0000 | ORAL_TABLET | ORAL | Status: DC
  Administered 2012-09-03 (×2): 2 via ORAL
  Administered 2012-09-04: 1 via ORAL
  Administered 2012-09-04: 2 via ORAL
  Administered 2012-09-04: 1 via ORAL
  Administered 2012-09-04: 2 via ORAL
  Filled 2012-09-03: qty 1
  Filled 2012-09-03 (×3): qty 2
  Filled 2012-09-03: qty 1
  Filled 2012-09-03 (×2): qty 2

## 2012-09-03 MED ORDER — FENTANYL CITRATE 0.05 MG/ML IJ SOLN
INTRAMUSCULAR | Status: DC | PRN
  Administered 2012-09-03 (×2): 50 ug via INTRAVENOUS

## 2012-09-03 MED ORDER — TRANEXAMIC ACID 100 MG/ML IV SOLN
1000.0000 mg | Freq: Once | INTRAVENOUS | Status: AC
  Administered 2012-09-03: 1000 mg via INTRAVENOUS
  Filled 2012-09-03: qty 10

## 2012-09-03 MED ORDER — 0.9 % SODIUM CHLORIDE (POUR BTL) OPTIME
TOPICAL | Status: DC | PRN
  Administered 2012-09-03: 1000 mL

## 2012-09-03 MED ORDER — DOCUSATE SODIUM 100 MG PO CAPS
100.0000 mg | ORAL_CAPSULE | Freq: Two times a day (BID) | ORAL | Status: DC
  Administered 2012-09-03 – 2012-09-04 (×2): 100 mg via ORAL

## 2012-09-03 MED ORDER — SODIUM CHLORIDE 0.9 % IV SOLN
INTRAVENOUS | Status: DC
  Administered 2012-09-03 – 2012-09-04 (×2): via INTRAVENOUS
  Filled 2012-09-03 (×8): qty 1000

## 2012-09-03 MED ORDER — ASPIRIN EC 325 MG PO TBEC
325.0000 mg | DELAYED_RELEASE_TABLET | Freq: Two times a day (BID) | ORAL | Status: DC
  Administered 2012-09-03 – 2012-09-04 (×2): 325 mg via ORAL
  Filled 2012-09-03 (×4): qty 1

## 2012-09-03 MED ORDER — DIPHENHYDRAMINE HCL 12.5 MG/5ML PO ELIX: 25.0000 mg | ORAL_SOLUTION | Freq: Four times a day (QID) | ORAL | Status: DC | PRN

## 2012-09-03 MED ORDER — STERILE WATER FOR IRRIGATION IR SOLN
Status: DC | PRN
  Administered 2012-09-03: 3000 mL

## 2012-09-03 MED ORDER — MIDAZOLAM HCL 5 MG/5ML IJ SOLN
INTRAMUSCULAR | Status: DC | PRN
  Administered 2012-09-03: 2 mg via INTRAVENOUS

## 2012-09-03 MED ORDER — ONDANSETRON HCL 4 MG PO TABS: 4.0000 mg | ORAL_TABLET | Freq: Four times a day (QID) | ORAL | Status: DC | PRN

## 2012-09-03 MED ORDER — PROPOFOL INFUSION 10 MG/ML OPTIME
INTRAVENOUS | Status: DC | PRN
  Administered 2012-09-03: 25 ug/kg/min via INTRAVENOUS

## 2012-09-03 MED ORDER — HYDROMORPHONE HCL PF 1 MG/ML IJ SOLN: 0.5000 mg | INTRAMUSCULAR | Status: DC | PRN

## 2012-09-03 MED ORDER — ZOLPIDEM TARTRATE 5 MG PO TABS: 5.0000 mg | ORAL_TABLET | Freq: Every evening | ORAL | Status: DC | PRN

## 2012-09-03 MED ORDER — ACETAMINOPHEN 500 MG PO TABS
1000.0000 mg | ORAL_TABLET | Freq: Once | ORAL | Status: AC
  Administered 2012-09-03: 1000 mg via ORAL
  Filled 2012-09-03: qty 2

## 2012-09-03 MED ORDER — CELECOXIB 200 MG PO CAPS
200.0000 mg | ORAL_CAPSULE | Freq: Once | ORAL | Status: AC
  Administered 2012-09-03: 200 mg via ORAL
  Filled 2012-09-03: qty 1

## 2012-09-03 MED ORDER — POLYETHYLENE GLYCOL 3350 17 G PO PACK
17.0000 g | PACK | Freq: Every day | ORAL | Status: DC | PRN
  Administered 2012-09-03: 17 g via ORAL

## 2012-09-03 MED ORDER — CEFAZOLIN SODIUM 1-5 GM-% IV SOLN
1.0000 g | Freq: Four times a day (QID) | INTRAVENOUS | Status: AC
  Administered 2012-09-03 (×2): 1 g via INTRAVENOUS
  Filled 2012-09-03 (×2): qty 50

## 2012-09-03 SURGICAL SUPPLY — 39 items
BAG ZIPLOCK 12X15 (MISCELLANEOUS) ×4 IMPLANT
BLADE SAW SGTL 18X1.27X75 (BLADE) ×2 IMPLANT
CAPT HIP PF COP ×2 IMPLANT
CLOTH BEACON ORANGE TIMEOUT ST (SAFETY) ×2 IMPLANT
DERMABOND ADVANCED (GAUZE/BANDAGES/DRESSINGS) ×1
DERMABOND ADVANCED .7 DNX12 (GAUZE/BANDAGES/DRESSINGS) ×1 IMPLANT
DRAPE C-ARM 42X120 X-RAY (DRAPES) ×2 IMPLANT
DRAPE STERI IOBAN 125X83 (DRAPES) ×2 IMPLANT
DRAPE U-SHAPE 47X51 STRL (DRAPES) ×6 IMPLANT
DRSG AQUACEL AG ADV 3.5X10 (GAUZE/BANDAGES/DRESSINGS) ×2 IMPLANT
DRSG MEPILEX BORDER 4X8 (GAUZE/BANDAGES/DRESSINGS) ×2 IMPLANT
DRSG TEGADERM 4X4.75 (GAUZE/BANDAGES/DRESSINGS) IMPLANT
DURAPREP 26ML APPLICATOR (WOUND CARE) ×2 IMPLANT
ELECT BLADE TIP CTD 4 INCH (ELECTRODE) ×2 IMPLANT
ELECT REM PT RETURN 9FT ADLT (ELECTROSURGICAL) ×2
ELECTRODE REM PT RTRN 9FT ADLT (ELECTROSURGICAL) ×1 IMPLANT
EVACUATOR 1/8 PVC DRAIN (DRAIN) IMPLANT
FACESHIELD LNG OPTICON STERILE (SAFETY) ×8 IMPLANT
GAUZE SPONGE 2X2 8PLY STRL LF (GAUZE/BANDAGES/DRESSINGS) ×1 IMPLANT
GLOVE BIOGEL PI IND STRL 7.5 (GLOVE) ×1 IMPLANT
GLOVE BIOGEL PI IND STRL 8 (GLOVE) ×1 IMPLANT
GLOVE BIOGEL PI INDICATOR 7.5 (GLOVE) ×1
GLOVE BIOGEL PI INDICATOR 8 (GLOVE) ×1
GLOVE ECLIPSE 8.0 STRL XLNG CF (GLOVE) ×2 IMPLANT
GLOVE ORTHO TXT STRL SZ7.5 (GLOVE) ×4 IMPLANT
GOWN BRE IMP PREV XXLGXLNG (GOWN DISPOSABLE) ×2 IMPLANT
GOWN STRL NON-REIN LRG LVL3 (GOWN DISPOSABLE) ×2 IMPLANT
KIT BASIN OR (CUSTOM PROCEDURE TRAY) ×2 IMPLANT
PACK TOTAL JOINT (CUSTOM PROCEDURE TRAY) ×2 IMPLANT
PADDING CAST COTTON 6X4 STRL (CAST SUPPLIES) ×2 IMPLANT
SPONGE GAUZE 2X2 STER 10/PKG (GAUZE/BANDAGES/DRESSINGS) ×1
SUCTION FRAZIER 12FR DISP (SUCTIONS) ×2 IMPLANT
SUT MNCRL AB 4-0 PS2 18 (SUTURE) ×2 IMPLANT
SUT VIC AB 1 CT1 36 (SUTURE) ×8 IMPLANT
SUT VIC AB 2-0 CT1 27 (SUTURE) ×4
SUT VIC AB 2-0 CT1 TAPERPNT 27 (SUTURE) ×2 IMPLANT
SUT VLOC 180 0 24IN GS25 (SUTURE) ×2 IMPLANT
TOWEL OR 17X26 10 PK STRL BLUE (TOWEL DISPOSABLE) ×4 IMPLANT
TRAY FOLEY CATH 14FRSI W/METER (CATHETERS) ×2 IMPLANT

## 2012-09-03 NOTE — Anesthesia Postprocedure Evaluation (Signed)
Anesthesia Post Note  Patient: Kathryn Gomez  Procedure(s) Performed: Procedure(s) (LRB): LEFT TOTAL HIP ARTHROPLASTY ANTERIOR APPROACH (Left)  Anesthesia type: Spinal  Patient location: PACU  Post pain: Pain level controlled  Post assessment: Post-op Vital signs reviewed  Last Vitals: BP 148/85  Pulse 82  Temp(Src) 36.4 C (Oral)  Resp 16  SpO2 100%  Post vital signs: Reviewed  Level of consciousness: sedated  Complications: No apparent anesthesia complications

## 2012-09-03 NOTE — Interval H&P Note (Signed)
History and Physical Interval Note:  09/03/2012 8:52 AM  [place]  has presented today for surgery, with the diagnosis of LEFT HIP OSTEOARHTRITIS  The various methods of treatment have been discussed with the patient and family. After consideration of risks, benefits and other options for treatment, the patient has consented to  Procedure(s): LEFT TOTAL HIP ARTHROPLASTY ANTERIOR APPROACH (Left) as a surgical intervention .  The patient's history has been reviewed, patient examined, no change in status, stable for surgery.  I have reviewed the patient's chart and labs.  Questions were answered to the patient's satisfaction.     Shelda Pal

## 2012-09-03 NOTE — Plan of Care (Signed)
Problem: Consults Goal: Diagnosis- Total Joint Replacement Left anterior hip     

## 2012-09-03 NOTE — Evaluation (Signed)
Physical Therapy Evaluation Patient Details Name: Kathryn Gomez MRN: [id number] DOB: 03/22/1940 Today's Date: 09/03/2012 Time: 4098-1191 PT Time Calculation (min): 29 min  PT Assessment / Plan / Recommendation History of Present Illness  S/P L DATHA on 09/03/12  Clinical Impression  Pt ambulated x 160 ft same day of surgery with min pain. Plans DC tomorrow after PT with family able to provide care.    PT Assessment  Patient needs continued PT services    Follow Up Recommendations  Home health PT    Does the patient have the potential to tolerate intense rehabilitation      Barriers to Discharge        Equipment Recommendations  Rolling walker with 5" wheels    Recommendations for Other Services     Frequency 7X/week    Precautions / Restrictions Precautions Precautions: None   Pertinent Vitals/Pain States hip and thigh are sore.ice applied. Had meds.      Mobility  Bed Mobility Bed Mobility: Supine to Sit Supine to Sit: 4: Min assist Details for Bed Mobility Assistance: assistanc for LLE to get to edge Transfers Transfers: Sit to Stand;Stand to Sit Sit to Stand: 4: Min assist;From bed;With upper extremity assist Stand to Sit: 4: Min assist;To chair/3-in-1;With upper extremity assist Details for Transfer Assistance: cues for hand placement and LLE position. Ambulation/Gait Ambulation/Gait Assistance: 4: Min assist Ambulation Distance (Feet): 160 Feet Assistive device: Rolling walker Ambulation/Gait Assistance Details: cues for sequence Gait Pattern: Step-through pattern    Exercises Total Joint Exercises Heel Slides: AAROM;Left;10 reps;Supine   PT Diagnosis: Difficulty walking;Acute pain  PT Problem List: Decreased strength;Decreased range of motion;Decreased activity tolerance;Decreased mobility;Decreased knowledge of use of DME;Decreased safety awareness;Decreased knowledge of precautions;Cardiopulmonary status limiting activity PT Treatment Interventions: DME  instruction;Gait training;Stair training;Functional mobility training;Therapeutic activities;Therapeutic exercise;Patient/family education     PT Goals(Current goals can be found in the care plan section) Acute Rehab PT Goals Patient Stated Goal: To have no more pain PT Goal Formulation: With patient/family Time For Goal Achievement: 09/10/12 Potential to Achieve Goals: Good  Visit Information  Last PT Received On: 09/03/12 Assistance Needed: +1 History of Present Illness: S/P L DATHA on 09/03/12       Prior Functioning  Home Living Family/patient expects to be discharged to:: Private residence Living Arrangements: Other relatives (grandson) Available Help at Discharge: Family Type of Home: House Home Access: Stairs to enter Secretary/administrator of Steps: 1 Home Layout: One level Home Equipment: None Prior Function Level of Independence: Independent Communication Communication: No difficulties    Cognition  Cognition Arousal/Alertness: Awake/alert Behavior During Therapy: WFL for tasks assessed/performed Overall Cognitive Status: Within Functional Limits for tasks assessed    Extremity/Trunk Assessment Lower Extremity Assessment Lower Extremity Assessment: LLE deficits/detail LLE Deficits / Details: min assist to flex hip in suopine. Able to advance without difficulty when ambulating. Cervical / Trunk Assessment Cervical / Trunk Assessment: Normal   Balance    End of Session PT - End of Session Activity Tolerance: Patient tolerated treatment well Patient left: in chair;with call bell/phone within reach Nurse Communication: Mobility status  GP     Rada Hay 09/03/2012, 5:08 PM Blanchard Kelch PT [phone]

## 2012-09-03 NOTE — Op Note (Signed)
NAME:  Kathryn Gomez                ACCOUNT NO.: [id number]      MEDICAL RECORD NO.: [id number]      FACILITY:  [facility]      PHYSICIAN:  Durene Romans D  DATE OF BIRTH:  May 23, 1940     DATE OF PROCEDURE:  09/03/2012                                 OPERATIVE REPORT         PREOPERATIVE DIAGNOSIS: Left  hip osteoarthritis.      POSTOPERATIVE DIAGNOSIS:  Left hip osteoarthritis.      PROCEDURE:  Left total hip replacement through an anterior approach   utilizing DePuy THR system, component size 50mm pinnacle cup, a size 32+4 neutral   Altrex liner, a size 0 Hi Tri Lock stem with a 32+1 delta ceramic   ball.      SURGEON:  Madlyn Frankel. Charlann Boxer, M.D.      ASSISTANT: Leilani Able, PA-C     ANESTHESIA:  Spinal.      SPECIMENS:  None.      COMPLICATIONS:  None.      BLOOD LOSS:  300 cc     DRAINS:  One Hemovac.      INDICATION OF THE PROCEDURE:  Kathryn Gomez is a 72 y.o. female who had   presented to office for evaluation of left hip pain.  Radiographs revealed   progressive degenerative changes with bone-on-bone   articulation to the  hip joint.  The patient had painful limited range of   motion significantly affecting their overall quality of life.  The patient was failing to    respond to conservative measures, and at this point was ready   to proceed with more definitive measures.  The patient has noted progressive   degenerative changes in his hip, progressive problems and dysfunction   with regarding the hip prior to surgery.  Consent was obtained for   benefit of pain relief.  Specific risk of infection, DVT, component   failure, dislocation, need for revision surgery, as well discussion of   the anterior versus posterior approach were reviewed.  Consent was   obtained for benefit of anterior pain relief through an anterior   approach.      PROCEDURE IN DETAIL:  The patient was brought to operative theater.   Once adequate anesthesia, preoperative  antibiotics, 2gm Ancef administered.   The patient was positioned supine on the OSI Hanna table.  Once adequate   padding of boney process was carried out, we had predraped out the hip, and  used fluoroscopy to confirm orientation of the pelvis and position.      The left hip was then prepped and draped from proximal iliac crest to   mid thigh with shower curtain technique.      Time-out was performed identifying the patient, planned procedure, and   extremity.     An incision was then made 2 cm distal and lateral to the   anterior superior iliac spine extending over the orientation of the   tensor fascia lata muscle and sharp dissection was carried down to the   fascia of the muscle and protractor placed in the soft tissues.      The fascia was then incised.  The muscle belly was identified and swept   laterally  and retractor placed along the superior neck.  Following   cauterization of the circumflex vessels and removing some pericapsular   fat, a second cobra retractor was placed on the inferior neck.  A third   retractor was placed on the anterior acetabulum after elevating the   anterior rectus.  A L-capsulotomy was along the line of the   superior neck to the trochanteric fossa, then extended proximally and   distally.  Tag sutures were placed and the retractors were then placed   intracapsular.  We then identified the trochanteric fossa and   orientation of my neck cut, confirmed this radiographically   and then made a neck osteotomy with the femur on traction.  The femoral   head was removed without difficulty or complication.  Traction was let   off and retractors were placed posterior and anterior around the   acetabulum.      The labrum and foveal tissue were debrided.  I began reaming with a 47mm   reamer and reamed up to 49mm reamer with good bony bed preparation and a 50   cup was chosen.  The final 50mm Pinnacle cup was then impacted under fluoroscopy  to confirm the  depth of penetration and orientation with respect to   abduction.  A screw was placed followed by the hole eliminator.  The final   32+4 neutral Altrex liner was impacted with good visualized rim fit.  The cup was positioned anatomically within the acetabular portion of the pelvis.      At this point, the femur was rolled at 80 degrees.  Further capsule was   released off the inferior aspect of the femoral neck.  I then   released the superior capsule proximally.  The hook was placed laterally   along the femur and elevated manually and held in position with the bed   hook.  The leg was then extended and adducted with the leg rolled to 100   degrees of external rotation.  Once the proximal femur was fully   exposed, I used a box osteotome to set orientation.  I then began   broaching with the starting chili pepper broach and passed this by hand and then broached up to just the 0 broach.  She was noted to have a very small femoral canal and before trialling we confirmed positioning of the trial in orthogonal views.  I was only going to be able to pass the 0.  With the 0 broach in place I chose a high offset neck and did a trial reduction with the 32+1 ball.  The offset was appropriate, leg lengths   appeared close and may have been a little long but i was not able to pass the 0 broach further down without complication.   Given these findings, I went ahead and dislocated the hip, repositioned all   retractors and positioned the right hip in the extended and abducted position.  The final 0 Hi Tri Lock stem was   chosen and it was impacted down to the level of neck cut.  Based on this   and the trial reduction, a 32+1 delta ceramic ball was chosen and   impacted onto a clean and dry trunnion, and the hip was reduced.  The   hip had been irrigated throughout the case again at this point.  I did   reapproximate the superior capsular leaflet to the anterior leaflet   using #1 Vicryl, placed a medium  Hemovac drain  deep.  The fascia of the   tensor fascia lata muscle was then reapproximated using #1 Vicryl.  The   remaining wound was closed with 2-0 Vicryl and running 4-0 Monocryl.   The hip was cleaned, dried, and dressed sterilely using Dermabond and   Aquacel dressing.  Drain site dressed separately.  She was then brought   to recovery room in stable condition tolerating the procedure well.    Leilani Able, PA-C was present for the entirety of the case involved from   preoperative positioning, perioperative retractor management, general   facilitation of the case, as well as primary wound closure as assistant.            Madlyn Frankel Charlann Boxer, M.D.            MDO/MEDQ  D:  11/22/2010  T:  11/22/2010  Job:  [id number]      Electronically Signed by Durene Romans M.D. on 11/28/2010 09:15:38 AM

## 2012-09-03 NOTE — Anesthesia Procedure Notes (Signed)
Spinal  Patient location during procedure: OR Start time: 09/03/2012 9:43 AM Staffing Anesthesiologist: Gaylan Gerold CRNA/Resident: [place], Roosevelt Eimers C Performed by: resident/CRNA  Preanesthetic Checklist Completed: patient identified, site marked, surgical consent, pre-op evaluation, timeout performed, IV checked, risks and benefits discussed and monitors and equipment checked Spinal Block Patient position: sitting Prep: Betadine Patient monitoring: heart rate, cardiac monitor, continuous pulse ox and blood pressure Approach: midline Location: L2-3 Injection technique: single-shot Needle Needle type: Sprotte  Needle gauge: 24 G Assessment Sensory level: T6

## 2012-09-03 NOTE — Progress Notes (Signed)
[facility]  [facility] is providing the following services: RW (patient declined commode - stated that her commode is tall enough)  If patient discharges after hours, please call [phone].   Renard Hamper [phone] 09/03/2012, 1:46 PM

## 2012-09-03 NOTE — Transfer of Care (Signed)
Immediate Anesthesia Transfer of Care Note  Patient: Kathryn Gomez  Procedure(s) Performed: Procedure(s) (LRB): LEFT TOTAL HIP ARTHROPLASTY ANTERIOR APPROACH (Left)  Patient Location: PACU  Anesthesia Type: Spinal  Level of Consciousness: sedated, patient cooperative and responds to stimulaton  Airway & Oxygen Therapy: Patient Spontanous Breathing and Patient connected to face mask oxgen  Post-op Assessment: Report given to PACU RN and Post -op Vital signs reviewed and stable  Post vital signs: Reviewed and stable  Complications: No apparent anesthesia complications

## 2012-09-03 NOTE — Progress Notes (Signed)
Utilization review completed.  

## 2012-09-03 NOTE — Anesthesia Preprocedure Evaluation (Addendum)
Anesthesia Evaluation  Patient identified by MRN, date of birth, ID band Patient awake    Reviewed: Allergy & Precautions, H&P , NPO status , Patient's Chart, lab work & pertinent test results  Airway Mallampati: II TM Distance: >3 FB Neck ROM: Full    Dental  (+) Dental Advisory Given, Poor Dentition, Missing and Chipped   Pulmonary asthma , pneumonia -, resolved,  breath sounds clear to auscultation        Cardiovascular hypertension, Pt. on medications Rhythm:Regular Rate:Normal     Neuro/Psych negative neurological ROS  negative psych ROS   GI/Hepatic negative GI ROS, Neg liver ROS,   Endo/Other  negative endocrine ROS  Renal/GU negative Renal ROS     Musculoskeletal negative musculoskeletal ROS (+)   Abdominal   Peds  Hematology negative hematology ROS (+)   Anesthesia Other Findings   Reproductive/Obstetrics negative OB ROS                          Anesthesia Physical Anesthesia Plan  ASA: II  Anesthesia Plan: Spinal   Post-op Pain Management:    Induction: Intravenous  Airway Management Planned:   Additional Equipment:   Intra-op Plan:   Post-operative Plan:   Informed Consent: I have reviewed the patients History and Physical, chart, labs and discussed the procedure including the risks, benefits and alternatives for the proposed anesthesia with the patient or authorized representative who has indicated his/her understanding and acceptance.   Dental advisory given  Plan Discussed with: CRNA  Anesthesia Plan Comments:        Anesthesia Quick Evaluation

## 2012-09-04 ENCOUNTER — Encounter (HOSPITAL_COMMUNITY): Payer: Self-pay | Admitting: Orthopedic Surgery

## 2012-09-04 DIAGNOSIS — D500 Iron deficiency anemia secondary to blood loss (chronic): Secondary | ICD-10-CM | POA: Diagnosis not present

## 2012-09-04 DIAGNOSIS — E663 Overweight: Secondary | ICD-10-CM

## 2012-09-04 LAB — CBC
HCT: 30.9 % — ABNORMAL LOW (ref 36.0–46.0)
Hemoglobin: 10.2 g/dL — ABNORMAL LOW (ref 12.0–15.0)
MCH: 30.8 pg (ref 26.0–34.0)
MCV: 93.4 fL (ref 78.0–100.0)
RBC: 3.31 MIL/uL — ABNORMAL LOW (ref 3.87–5.11)
WBC: 13.1 10*3/uL — ABNORMAL HIGH (ref 4.0–10.5)

## 2012-09-04 LAB — BASIC METABOLIC PANEL
CO2: 23 mEq/L (ref 19–32)
Calcium: 8.1 mg/dL — ABNORMAL LOW (ref 8.4–10.5)
Chloride: 106 mEq/L (ref 96–112)
Creatinine, Ser: 0.8 mg/dL (ref 0.50–1.10)
Glucose, Bld: 147 mg/dL — ABNORMAL HIGH (ref 70–99)

## 2012-09-04 MED ORDER — FERROUS SULFATE 325 (65 FE) MG PO TABS: 325.0000 mg | ORAL_TABLET | Freq: Three times a day (TID) | ORAL | Status: DC

## 2012-09-04 MED ORDER — DSS 100 MG PO CAPS: 100.0000 mg | ORAL_CAPSULE | Freq: Two times a day (BID) | ORAL | Status: DC

## 2012-09-04 MED ORDER — TIZANIDINE HCL 4 MG PO CAPS: 4.0000 mg | ORAL_CAPSULE | Freq: Three times a day (TID) | ORAL | Status: DC | PRN

## 2012-09-04 MED ORDER — ASPIRIN 325 MG PO TBEC: 325.0000 mg | DELAYED_RELEASE_TABLET | Freq: Two times a day (BID) | ORAL | Status: AC

## 2012-09-04 MED ORDER — POLYETHYLENE GLYCOL 3350 17 G PO PACK: 17.0000 g | PACK | Freq: Every day | ORAL | Status: DC | PRN

## 2012-09-04 MED ORDER — HYDROCODONE-ACETAMINOPHEN 7.5-325 MG PO TABS: 1.0000 | ORAL_TABLET | ORAL | Status: DC | PRN

## 2012-09-04 NOTE — Care Management Note (Signed)
    Page 1 of 2   09/04/2012     12:30:00 PM   CARE MANAGEMENT NOTE 09/04/2012  Patient:  Kathryn Gomez, Kathryn Gomez   Account Number:  [id number]  Date Initiated:  09/04/2012  Documentation initiated by:  Colleen Can  Subjective/Objective Assessment:   dx left hip osteoarthritis; left total hip replacemnt-anterior approach.    Per-aranged with Genevieve Norlander for HHpt services upon discharge. Services will start within 48hrs of d./c.     Action/Plan:   CM spoke with patient. Plans are for patient to return to her home in [place] where her graqndson who lives with her will be caregiver. States her daughter who lives close by will also assist in her care. She will need RW   Anticipated DC Date:  09/04/2012   Anticipated DC Plan:  HOME W HOME HEALTH SERVICES      DC Planning Services  CM consult      [facility] Choice  HOME HEALTH  DURABLE MEDICAL EQUIPMENT   Choice offered to / List presented to:  C-1 Patient   DME arranged  Levan Hurst      DME agency  [facility]     HH arranged  HH-2 PT      [facility] agency  [facility]   Status of Kathryn Gomez:  Completed, signed off Medicare Important Message given?  NA - LOS <3 / Initial given by admissions (If response is "NO", the following Medicare IM given date fields will be blank) Date Medicare IM given:   Date Additional Medicare IM given:    Discharge Disposition:  HOME W HOME HEALTH SERVICES  Per UR Regulation:    If discussed at Long Length of Stay Meetings, dates discussed:    Comments:

## 2012-09-04 NOTE — Progress Notes (Signed)
Utilization review completed.  

## 2012-09-04 NOTE — Progress Notes (Signed)
   Subjective: 1 Day Post-Op Procedure(s) (LRB): LEFT TOTAL HIP ARTHROPLASTY ANTERIOR APPROACH (Left)   Patient reports pain as mild, pain well controlled. No events throughout the night. Ready to be discharged home.  Objective:   VITALS:   Filed Vitals:   09/04/12 0503  BP: 115/73  Pulse: 64  Temp: 97.9 F (36.6 C)  Resp: 15    Neurovascular intact Dorsiflexion/Plantar flexion intact Incision: dressing C/D/I No cellulitis present Compartment soft  LABS  Recent Labs  09/04/12 0425  HGB 10.2*  HCT 30.9*  WBC 13.1*  PLT 280     Recent Labs  09/04/12 0425  NA 137  K 4.6  BUN 15  CREATININE 0.80  GLUCOSE 147*     Assessment/Plan: 1 Day Post-Op Procedure(s) (LRB): LEFT TOTAL HIP ARTHROPLASTY ANTERIOR APPROACH (Left) Foley cath d/c'ed Advance diet Up with therapy D/C IV fluids Discharge home with home health Follow up in 2 weeks at [facility]. Follow up with OLIN,Keishana Klinger D in 2 weeks.  Contact information:  [facility] [address] [place] [address] [phone]    Expected ABLA  Treated with iron and will observe  Overweight (BMI 25-29.9) Estimated body mass index is 25.29 kg/(m^2) as calculated from the following:   Height as of this encounter: 5\' 5"  (1.651 m).   Weight as of this encounter: 68.947 kg (152 lb). Patient also counseled that weight may inhibit the healing process Patient counseled that losing weight will help with future health issues        Anastasio Auerbach. Janai Brannigan   PAC  09/04/2012, 9:32 AM

## 2012-09-04 NOTE — Evaluation (Signed)
Occupational Therapy Evaluation Patient Details Name: Kathryn Gomez MRN: [id number] DOB: 1940-08-30 Today's Date: 09/04/2012 Time: 1030-1050 OT Time Calculation (min): 20 min  OT Assessment / Plan / Recommendation History of present illness S/P L DATHA on 09/03/12   Clinical Impression   Pt is doing well and has family support at d/c. No DME needs. All education completed with pt.     OT Assessment  Patient does not need any further OT services    Follow Up Recommendations  No OT follow up;Supervision - Intermittent    Barriers to Discharge      Equipment Recommendations  None recommended by OT    Recommendations for Other Services    Frequency       Precautions / Restrictions Precautions Precautions: None Restrictions Weight Bearing Restrictions: No   Pertinent Vitals/Pain 5/10; reposition; ice    ADL  Eating/Feeding: Simulated;Independent Where Assessed - Eating/Feeding: Chair Grooming: Performed;Wash/dry hands;Supervision/safety Where Assessed - Grooming: Unsupported standing Upper Body Bathing: Simulated;Chest;Right arm;Left arm;Abdomen;Set up Where Assessed - Upper Body Bathing: Unsupported sitting Lower Body Bathing: Simulated;Supervision/safety Where Assessed - Lower Body Bathing: Supported sit to stand Upper Body Dressing: Simulated;Set up Where Assessed - Upper Body Dressing: Unsupported sitting Lower Body Dressing: Simulated;Supervision/safety Where Assessed - Lower Body Dressing: Supported sit to stand Toilet Transfer: Research scientist (life sciences) Method:  (with walker) Acupuncturist: Comfort height toilet Toileting - Clothing Manipulation and Hygiene: Performed;Supervision/safety Where Assessed - Engineer, mining and Hygiene: Sit to stand from 3-in-1 or toilet Equipment Used: Rolling walker ADL Comments: Pt able to bend L LE to reach foot for dressing. Doing very well and has assist at d/c . She plans to sponge  bathe initially at d/c but discussed safety with showering, having assist and where to obtain tubseat if needed. She has a high toilet and can reach to edge of toielt seat for support with transitions.    OT Diagnosis:    OT Problem List:   OT Treatment Interventions:     OT Goals(Current goals can be found in the care plan section) Acute Rehab OT Goals Patient Stated Goal: To have no more pain  Visit Information  Last OT Received On: 09/04/12 Assistance Needed: +1 History of Present Illness: S/P L DATHA on 09/03/12       Prior Functioning     Home Living Family/patient expects to be discharged to:: Private residence Living Arrangements: Other relatives (grandson) Available Help at Discharge: Family Type of Home: House Home Access: Stairs to enter Secretary/administrator of Steps: 1 Home Layout: One level Home Equipment: None Prior Function Level of Independence: Independent Communication Communication: No difficulties         Vision/Perception     Cognition  Cognition Arousal/Alertness: Awake/alert Behavior During Therapy: WFL for tasks assessed/performed Overall Cognitive Status: Within Functional Limits for tasks assessed    Extremity/Trunk Assessment Upper Extremity Assessment Upper Extremity Assessment: Overall WFL for tasks assessed     Mobility Transfers Transfers: Sit to Stand;Stand to Sit Sit to Stand: 5: Supervision;With upper extremity assist;From chair/3-in-1 Stand to Sit: 5: Supervision;To toilet;With upper extremity assist     Exercise     Balance Balance Balance Assessed: Yes Dynamic Standing Balance Dynamic Standing - Level of Assistance: 5: Stand by assistance   End of Session OT - End of Session Activity Tolerance: Patient tolerated treatment well Patient left: in chair;with call bell/phone within reach  GO     Lennox Laity [phone] 09/04/2012, 11:11 AM

## 2012-09-04 NOTE — Progress Notes (Signed)
Physical Therapy Treatment Patient Details Name: Kathryn Gomez MRN: [id number] DOB: [birth year] Today's Date: 09/04/2012 Time: 0825-0900 PT Time Calculation (min): 35 min  PT Assessment / Plan / Recommendation  History of Present Illness S/P L DATHA on 09/03/12   PT Comments   Pt is progressing very well. Plans to DC today.  Follow Up Recommendations  Home health PT     Does the patient have the potential to tolerate intense rehabilitation     Barriers to Discharge        Equipment Recommendations  Rolling walker with 5" wheels    Recommendations for Other Services    Frequency 7X/week   Progress towards PT Goals Progress towards PT goals: Progressing toward goals  Plan Current plan remains appropriate    Precautions / Restrictions Precautions Precautions: None Restrictions Weight Bearing Restrictions: No   Pertinent Vitals/Pain No pain    Mobility  Bed Mobility Supine to Sit: 5: Supervision Transfers Sit to Stand: 5: Supervision;With upper extremity assist;From chair/3-in-1 Stand to Sit: 5: Supervision;To toilet;With upper extremity assist Details for Transfer Assistance: cues for hand placement and LLE position. Ambulation/Gait Ambulation/Gait Assistance: 5: Supervision Ambulation Distance (Feet): 300 Feet Assistive device: Rolling walker Gait Pattern: Step-through pattern    Exercises Total Joint Exercises Quad Sets: AROM;Both;10 reps Short Arc Quad: AROM;Left;10 reps Heel Slides: AROM;Left;10 reps Hip ABduction/ADduction: AROM;Left;10 reps Long Arc Quad: AROM;Left;10 reps   PT Diagnosis:    PT Problem List:   PT Treatment Interventions:     PT Goals (current goals can now be found in the care plan section) Acute Rehab PT Goals Patient Stated Goal: To have no more pain  Visit Information  Last PT Received On: 09/04/12 Assistance Needed: +1 History of Present Illness: S/P L DATHA on 09/03/12    Subjective Data  Patient Stated Goal: To have no more pain   Cognition  Cognition Arousal/Alertness: Awake/alert Behavior During Therapy: WFL for tasks assessed/performed Overall Cognitive Status: Within Functional Limits for tasks assessed    Balance  Balance Balance Assessed: Yes Dynamic Standing Balance Dynamic Standing - Level of Assistance: 5: Stand by assistance  End of Session PT - End of Session Activity Tolerance: Patient tolerated treatment well Patient left: in chair;with call bell/phone within reach Nurse Communication: Mobility status   GP     Rada Hay 09/04/2012, 12:08 PM

## 2012-09-05 NOTE — Discharge Summary (Signed)
Physician Discharge Summary  Patient ID: Kathryn Gomez MRN: [id number] DOB/AGE: 03-25-1940 72 y.o.  Admit date: 09/03/2012 Discharge date: 09/04/2012   Procedures:  Procedure(s) (LRB): LEFT TOTAL HIP ARTHROPLASTY ANTERIOR APPROACH (Left)  Attending Physician:  Dr. Durene Romans   Admission Diagnoses:   Left hip OA / pain  Discharge Diagnoses:  Principal Problem:   S/P left THA, AA Active Problems:   Expected blood loss anemia   Overweight (BMI 25.0-29.9)  Past Medical History  Diagnosis Date  . Asthma   . Allergy   . Cataract     bilateral  . Hypertension   . Hypercholesteremia   . Pneumonia     hx of  . Arthritis     HPI: Kathryn Gomez, 72 y.o. female, has a history of pain and functional disability in the left hip(s) due to arthritis and patient has failed non-surgical conservative treatments for greater than 12 weeks to include NSAID's and/or analgesics and activity modification. Onset of symptoms was gradual starting 1 years ago with rapidlly worsening course for about 6 months.The patient noted no past surgery on the left hip(s). Patient currently rates pain in the left hip at 7 out of 10 with activity. Patient has night pain, worsening of pain with activity and weight bearing, trendelenberg gait, pain that interfers with activities of daily living and pain with passive range of motion. Patient has evidence of periarticular osteophytes and joint space narrowing by imaging studies. This condition presents safety issues increasing the risk of falls. There is no current active signs of infection. Risks, benefits and expectations were discussed with the patient. Patient understand the risks, benefits and expectations and wishes to proceed with surgery.   PCP: Dow Adolph, MD   Discharged Condition: good  Hospital Course:  Patient underwent the above stated procedure on 09/03/2012. Patient tolerated the procedure well and brought to the recovery room in good condition and  subsequently to the floor.  POD #1 BP: 115/73 ; Pulse: 64 ; Temp: 97.9 F (36.6 C) ; Resp: 15  Pt's foley was removed, as well as the hemovac drain removed. IV was changed to a saline lock. Patient reports pain as mild, pain well controlled. No events throughout the night. Ready to be discharged home. Neurovascular intact, dorsiflexion/plantar flexion intact, incision: dressing C/D/I, no cellulitis present and compartment soft.   LABS  Basename    HGB  10.2  HCT  30.9    Discharge Exam: General appearance: alert and no distress Extremities: Homans sign is negative, no sign of DVT, no edema, redness or tenderness in the calves or thighs and no ulcers, gangrene or trophic changes  Disposition:    Home-Health Care Svc with follow up in 2 weeks   Follow-up Information   Follow up with Shelda Pal, MD. Schedule an appointment as soon as possible for a visit in 2 weeks.   Contact information:   [address] [place] [address] [phone]       Discharge Orders   Future Appointments Provider Department Dept Phone   12/19/2012 10:00 AM Maurice March, MD [facility] [phone]   Future Orders Complete By Expires     Call MD / Call 911  As directed     Comments:      If you experience chest pain or shortness of breath, CALL 911 and be transported to the hospital emergency room.  If you develope a fever above 101 F, pus (white drainage) or increased drainage or redness  at the wound, or calf pain, call your surgeon's office.    Change dressing  As directed     Comments:      Maintain surgical dressing for 10-14 days, then replace with 4x4 guaze and tape. Keep the area dry and clean.    Constipation Prevention  As directed     Comments:      Drink plenty of fluids.  Prune juice may be helpful.  You may use a stool softener, such as Colace (over the counter) 100 mg twice a day.  Use MiraLax (over the counter) for constipation as needed.     Diet - low sodium heart healthy  As directed     Discharge instructions  As directed     Comments:      Maintain surgical dressing for 10-14 days, then replace with gauze and tape. Keep the area dry and clean until follow up. Follow up in 2 weeks at [facility]. Call with any questions or concerns.    Increase activity slowly as tolerated  As directed     TED hose  As directed     Comments:      Use stockings (TED hose) for 2 weeks on both leg(s).  You may remove them at night for sleeping.    Weight bearing as tolerated  As directed          Medication List    STOP taking these medications       aspirin 81 MG tablet  Replaced by:  aspirin 325 MG EC tablet     ibuprofen 200 MG tablet  Commonly known as:  ADVIL,MOTRIN     meloxicam 15 MG tablet  Commonly known as:  MOBIC      TAKE these medications       amLODipine 5 MG tablet  Commonly known as:  NORVASC  Take 5 mg by mouth every morning.     aspirin 325 MG EC tablet  Take 1 tablet (325 mg total) by mouth 2 (two) times daily.     beclomethasone 80 MCG/ACT inhaler  Commonly known as:  QVAR  Inhale 2 puffs into the lungs daily as needed (for breathing).     DSS 100 MG Caps  Take 100 mg by mouth 2 (two) times daily.     EYE VITAMINS Caps  Take 1 capsule by mouth daily.     ferrous sulfate 325 (65 FE) MG tablet  Take 1 tablet (325 mg total) by mouth 3 (three) times daily after meals.     HYDROcodone-acetaminophen 7.5-325 MG per tablet  Commonly known as:  NORCO  Take 1-2 tablets by mouth every 4 (four) hours as needed for pain.     ipratropium 0.03 % nasal spray  Commonly known as:  ATROVENT  PLACE 2 SPRAYS INTO THE NOSE EVERY 12 (TWELVE) HOURS.     lisinopril-hydrochlorothiazide 20-12.5 MG per tablet  Commonly known as:  PRINZIDE,ZESTORETIC  Take 2 tablets by mouth every morning.     polyethylene glycol packet  Commonly known as:  MIRALAX / GLYCOLAX  Take 17 g by mouth daily as needed.      simvastatin 20 MG tablet  Commonly known as:  ZOCOR  Take 20 mg by mouth every evening.     tiZANidine 4 MG capsule  Commonly known as:  ZANAFLEX  Take 1 capsule (4 mg total) by mouth 3 (three) times daily as needed for muscle spasms.         Signed: Anastasio Auerbach.  Kathryn Gomez   PAC  09/05/2012, 9:32 AM

## 2012-09-11 ENCOUNTER — Other Ambulatory Visit: Payer: Self-pay | Admitting: *Deleted

## 2012-09-11 MED ORDER — LISINOPRIL-HYDROCHLOROTHIAZIDE 20-12.5 MG PO TABS: 2.0000 | ORAL_TABLET | Freq: Every morning | ORAL | Status: DC

## 2012-11-14 ENCOUNTER — Ambulatory Visit (INDEPENDENT_AMBULATORY_CARE_PROVIDER_SITE_OTHER): Payer: Medicare Other

## 2012-11-14 DIAGNOSIS — Z23 Encounter for immunization: Secondary | ICD-10-CM

## 2012-11-14 NOTE — Addendum Note (Signed)
Addended by: Cydney Ok on: 11/14/2012 01:15 PM   Modules accepted: Level of Service

## 2012-12-04 ENCOUNTER — Ambulatory Visit (INDEPENDENT_AMBULATORY_CARE_PROVIDER_SITE_OTHER): Payer: Medicare Other | Admitting: Family Medicine

## 2012-12-04 VITALS — BP 88/58 | HR 96 | Temp 99.2°F | Resp 18 | Wt 147.0 lb

## 2012-12-04 DIAGNOSIS — J069 Acute upper respiratory infection, unspecified: Secondary | ICD-10-CM

## 2012-12-04 DIAGNOSIS — R059 Cough, unspecified: Secondary | ICD-10-CM

## 2012-12-04 DIAGNOSIS — J45901 Unspecified asthma with (acute) exacerbation: Secondary | ICD-10-CM

## 2012-12-04 DIAGNOSIS — R05 Cough: Secondary | ICD-10-CM

## 2012-12-04 DIAGNOSIS — J4521 Mild intermittent asthma with (acute) exacerbation: Secondary | ICD-10-CM

## 2012-12-04 DIAGNOSIS — R0981 Nasal congestion: Secondary | ICD-10-CM

## 2012-12-04 DIAGNOSIS — J3489 Other specified disorders of nose and nasal sinuses: Secondary | ICD-10-CM

## 2012-12-04 DIAGNOSIS — R062 Wheezing: Secondary | ICD-10-CM

## 2012-12-04 MED ORDER — BECLOMETHASONE DIPROPIONATE 80 MCG/ACT IN AERS: INHALATION_SPRAY | RESPIRATORY_TRACT | Status: DC

## 2012-12-04 MED ORDER — ALBUTEROL SULFATE HFA 108 (90 BASE) MCG/ACT IN AERS: 2.0000 | INHALATION_SPRAY | RESPIRATORY_TRACT | Status: DC | PRN

## 2012-12-04 MED ORDER — ALBUTEROL SULFATE (2.5 MG/3ML) 0.083% IN NEBU
2.5000 mg | INHALATION_SOLUTION | Freq: Once | RESPIRATORY_TRACT | Status: AC
  Administered 2012-12-04: 2.5 mg via RESPIRATORY_TRACT

## 2012-12-04 NOTE — Progress Notes (Signed)
72 year old lady who has had a respiratory tract infection for the past several days. She has been coughing a lot. It started as a cough. She's not had much other symptoms. She's not had any fever that she knows of. She has been out of work since August because she had a total hip. She is still in the recovery phase of that. She is not walking with a cane now, but still limps a good deal. She is not aware of wheezing. She does use a Qvar inhaler.  Objective: TMs are normal. Throat clear. Poor dentition. Neck supple without significant nodes. Chest has soft wheezing primarily on the right side of the lungs. Heart regular without any murmurs.  Assessment: Cough Wheezing  Plan: Albuterol nebulizer  Air movement sounds better. She has still a tiny wheeze on the right. She feels like there is moving better.  Assessment: URI and asthmatic bronchitis

## 2012-12-04 NOTE — Patient Instructions (Signed)
Drink plenty of fluids  Use the albuterol inhaler two inhalations every 4-6 hours when awake to try and open up the lungs and reduced cough  Use the betamethasone inhaler (Qvar) 2 puffs twice daily  Take the Mucinex to help thin secretions  Return if worse

## 2012-12-19 ENCOUNTER — Encounter: Payer: Self-pay | Admitting: Family Medicine

## 2012-12-19 ENCOUNTER — Ambulatory Visit (INDEPENDENT_AMBULATORY_CARE_PROVIDER_SITE_OTHER): Payer: Medicare Other | Admitting: Family Medicine

## 2012-12-19 VITALS — BP 135/80 | HR 81 | Temp 98.7°F | Resp 16 | Ht 65.5 in | Wt 146.0 lb

## 2012-12-19 DIAGNOSIS — Z23 Encounter for immunization: Secondary | ICD-10-CM

## 2012-12-19 DIAGNOSIS — J4521 Mild intermittent asthma with (acute) exacerbation: Secondary | ICD-10-CM

## 2012-12-19 DIAGNOSIS — J45901 Unspecified asthma with (acute) exacerbation: Secondary | ICD-10-CM

## 2012-12-19 DIAGNOSIS — D500 Iron deficiency anemia secondary to blood loss (chronic): Secondary | ICD-10-CM

## 2012-12-19 DIAGNOSIS — I10 Essential (primary) hypertension: Secondary | ICD-10-CM

## 2012-12-19 LAB — CBC WITH DIFFERENTIAL/PLATELET
Basophils Absolute: 0 10*3/uL (ref 0.0–0.1)
Basophils Relative: 1 % (ref 0–1)
Eosinophils Absolute: 0.4 10*3/uL (ref 0.0–0.7)
Eosinophils Relative: 7 % — ABNORMAL HIGH (ref 0–5)
Lymphs Abs: 1.7 10*3/uL (ref 0.7–4.0)
MCH: 30.4 pg (ref 26.0–34.0)
MCV: 90 fL (ref 78.0–100.0)
Neutrophils Relative %: 59 % (ref 43–77)
Platelets: 299 10*3/uL (ref 150–400)
RBC: 4.11 MIL/uL (ref 3.87–5.11)
RDW: 14 % (ref 11.5–15.5)

## 2012-12-19 LAB — COMPREHENSIVE METABOLIC PANEL
ALT: 9 U/L (ref 0–35)
Albumin: 4 g/dL (ref 3.5–5.2)
Alkaline Phosphatase: 81 U/L (ref 39–117)
CO2: 25 mEq/L (ref 19–32)
Glucose, Bld: 117 mg/dL — ABNORMAL HIGH (ref 70–99)
Potassium: 3.8 mEq/L (ref 3.5–5.3)
Sodium: 140 mEq/L (ref 135–145)
Total Bilirubin: 0.3 mg/dL (ref 0.3–1.2)
Total Protein: 6.7 g/dL (ref 6.0–8.3)

## 2012-12-19 MED ORDER — LISINOPRIL-HYDROCHLOROTHIAZIDE 20-12.5 MG PO TABS: 2.0000 | ORAL_TABLET | Freq: Every morning | ORAL | Status: DC

## 2012-12-19 MED ORDER — AMLODIPINE BESYLATE 5 MG PO TABS: 5.0000 mg | ORAL_TABLET | Freq: Every morning | ORAL | Status: DC

## 2012-12-19 MED ORDER — ALBUTEROL SULFATE HFA 108 (90 BASE) MCG/ACT IN AERS: 2.0000 | INHALATION_SPRAY | RESPIRATORY_TRACT | Status: DC | PRN

## 2012-12-19 MED ORDER — SIMVASTATIN 20 MG PO TABS: 20.0000 mg | ORAL_TABLET | Freq: Every evening | ORAL | Status: DC

## 2012-12-19 NOTE — Progress Notes (Signed)
S:  This 72 y.o. AA female is s/p THR by Dr. Charlann Boxer in August 2014. She has recovered sufficiently to return to work. She has minimal pain but requests a DMV Handicap placard. Pt has HTN and is compliant w/ medications w/o adverse effects. She was seen ~ 2 weeks ago at [facility] for eval of cough; that symptoms has resolved. She has Asthma and now uses QVAR daily as well as albuterol MDI prn because of increased respiratory symptoms w/ cold weather.  Pt has blood loss anemia immediately after hip replacement; she has no c/o fatigue, cold intolerance, palpitations, muscle cramps, limb pain, edema or CNS symptoms. CBC has not been rechecked.  Patient Active Problem List   Diagnosis Date Noted  . Expected blood loss anemia 09/04/2012  . Overweight (BMI 25.0-29.9) 09/04/2012  . S/P left THA, AA 09/03/2012  . Chronic left hip pain 08/15/2012  . High cholesterol 03/26/2011  . Hypertension 03/26/2011  . Asthma 03/26/2011   PMHx, Surg Hx, Soc and Fam  Hx reviewed. Medications reconciled.  ROS: As per HPI; negative for appetite change or unexpected weight loss, fever/chills, sore throat or swallowing difficulties, vision disturbances, CP or tightness, palpitations, SOB or DOE, edema, GI problems, skin changes/rashes/pallor, HA, dizziness, weakness, numbness or syncope.  O: Filed Vitals:   12/19/12 0922  BP: 135/80  Pulse: 81  Temp: 98.7 F (37.1 C)  Resp: 16   GEN: in NAD; WN,WD. HENT: [place]/AT; EOMI w/ clear conj/sclerae. EACs/ nose/oroph unremarkable except for missing teeth. NECK: Supple. No LAN ort TMG. COR: RRR. No edema. LUNGS: Normal resp rate and effort. No wheezes. SKIN: W&D; intact w/o erythema, rashes or pallor. MS: MAEs; no c/c/e. Gait- mildly antalgic favoring L leg/hip. NEURO: A&Ox 3; CNs intact. Nonfocal.  A/P: Hypertension - Stable and well controlled; continue current medications. Plan: Comprehensive metabolic panel  Expected blood loss anemia - Post-op Hgb= 10.2 g/dL w/  normal indices.     Plan: CBC with Differential  Acute asthmatic bronchitis, mild intermittent - Continue same MDIs; reminded pt to use QVAR daily and rinse mouth after use. I called pt's pharmacy to insure that she had refills on ipratropium 0.03% nasal spray.. Plan: albuterol (PROVENTIL HFA;VENTOLIN HFA) 108 (90 BASE) MCG/ACT inhaler  Need for prophylactic vaccination with combined diphtheria-tetanus-pertussis (DTP) vaccine - Plan: Tdap vaccine greater than or equal to 7yo IM  Meds ordered this encounter  Medications  . albuterol (PROVENTIL HFA;VENTOLIN HFA) 108 (90 BASE) MCG/ACT inhaler    Sig: Inhale 2 puffs into the lungs every 4 (four) hours as needed for wheezing or shortness of breath (cough, shortness of breath or wheezing.).    Dispense:  1 Inhaler    Refill:  5  . amLODipine (NORVASC) 5 MG tablet    Sig: Take 1 tablet (5 mg total) by mouth every morning.    Dispense:  30 tablet    Refill:  5  . lisinopril-hydrochlorothiazide (PRINZIDE,ZESTORETIC) 20-12.5 MG per tablet    Sig: Take 2 tablets by mouth every morning.    Dispense:  60 tablet    Refill:  5  . simvastatin (ZOCOR) 20 MG tablet    Sig: Take 1 tablet (20 mg total) by mouth every evening.    Dispense:  30 tablet    Refill:  5

## 2012-12-24 NOTE — Progress Notes (Signed)
Quick Note:  Please notify pt that results are normal.   Provide pt with copy of labs. ______ 

## 2012-12-27 ENCOUNTER — Other Ambulatory Visit: Payer: Self-pay | Admitting: Physician Assistant

## 2013-01-17 ENCOUNTER — Telehealth: Payer: Self-pay

## 2013-01-17 NOTE — Telephone Encounter (Signed)
Try for a PA. This is the medication dose required for blood pressure control and pt has taken this for almost 2 years at least.  Pt takes 1 other anti-hypertensive and has no adverse effects on these medications.

## 2013-01-17 NOTE — Telephone Encounter (Signed)
[employer] will no longer cover more than 1 tab QD of lisinop/HCTZ 20-12.5. Do you want to chg med/dose or have me try for a PA?

## 2013-01-29 NOTE — Telephone Encounter (Signed)
Faxed in req for a PA form to be sent.

## 2013-01-30 ENCOUNTER — Telehealth: Payer: Self-pay | Admitting: Radiology

## 2013-01-30 NOTE — Telephone Encounter (Signed)
[employer] [facility] called back and prior authorization not required,for Lisinopril/HCTZ asked him if a quantity limit over ride is needed, he is verifying the information received is correct. I asked him to hold to speak to you but he disconnected, you may need to call back regarding quantity limit over ride, he is stating something on form needs verification.

## 2013-02-07 NOTE — Telephone Encounter (Signed)
See notes under other phone message 

## 2013-02-07 NOTE — Telephone Encounter (Signed)
Never have received the [facility]. Completed covermymeds which will hopefully be the form that is needed.

## 2013-02-10 NOTE — Telephone Encounter (Signed)
PA approved through 01/29/14. Notified pt on VM.

## 2013-04-17 ENCOUNTER — Encounter: Payer: Self-pay | Admitting: Family Medicine

## 2013-04-17 ENCOUNTER — Ambulatory Visit (INDEPENDENT_AMBULATORY_CARE_PROVIDER_SITE_OTHER): Payer: Medicare Other | Admitting: Family Medicine

## 2013-04-17 VITALS — BP 150/76 | HR 69 | Temp 98.9°F | Resp 16 | Ht 64.0 in | Wt 146.2 lb

## 2013-04-17 DIAGNOSIS — J45909 Unspecified asthma, uncomplicated: Secondary | ICD-10-CM

## 2013-04-17 DIAGNOSIS — I10 Essential (primary) hypertension: Secondary | ICD-10-CM

## 2013-04-17 MED ORDER — LISINOPRIL-HYDROCHLOROTHIAZIDE 20-12.5 MG PO TABS: 2.0000 | ORAL_TABLET | Freq: Every morning | ORAL | Status: DC

## 2013-04-17 MED ORDER — FLUTICASONE PROPIONATE HFA 110 MCG/ACT IN AERO: 2.0000 | INHALATION_SPRAY | Freq: Two times a day (BID) | RESPIRATORY_TRACT | Status: DC

## 2013-04-17 NOTE — Progress Notes (Signed)
S:  This 73 y.o. AA female is here for HTN follow-up; she is compliant w/ medications w/o adverse effects. Pt had some turmoil in her home associated with her son's unstable realtionship w/ his spouse/ significant other. No reported fatigue, diaphoresis, CP or tightness, palpitations, SOB or DOE, cough or edema, HA, dizziness, numbness, weakness or syncope.  Pt has Asthma; she has not been using QVAR due to cost of med. Her son has given her one if his QVAR MDI units. She does not have daily cough, wheezing or nocturnal symptoms. Her exercise tolerance is good.    Patient Active Problem List   Diagnosis Date Noted  . Expected blood loss anemia 09/04/2012  . Overweight (BMI 25.0-29.9) 09/04/2012  . S/P left THA, AA 09/03/2012  . Chronic left hip pain 08/15/2012  . High cholesterol 03/26/2011  . Hypertension 03/26/2011  . Asthma 03/26/2011    Prior to Admission medications   Medication Sig Start Date End Date Taking? Authorizing Provider  albuterol (PROVENTIL HFA;VENTOLIN HFA) 108 (90 BASE) MCG/ACT inhaler Inhale 2 puffs into the lungs every 4 (four) hours as needed for wheezing or shortness of breath (cough, shortness of breath or wheezing.). 12/19/12  Yes Maurice MarchBarbara B Gesselle Fitzsimons, MD  amLODipine (NORVASC) 5 MG tablet Take 1 tablet (5 mg total) by mouth every morning. 12/19/12  Yes Maurice MarchBarbara B Mary-Anne Polizzi, MD  docusate sodium 100 MG CAPS Take 100 mg by mouth 2 (two) times daily. 09/04/12  Yes Genelle GatherMatthew Scott Babish, PA-C  ipratropium (ATROVENT) 0.03 % nasal spray PLACE 2 SPRAYS INTO THE NOSE EVERY 12 (TWELVE) HOURS. 08/30/12  Yes Maurice MarchBarbara B Deeann Servidio, MD  lisinopril-hydrochlorothiazide (PRINZIDE,ZESTORETIC) 20-12.5 MG per tablet Take 2 tablets by mouth every morning.   Yes Maurice MarchBarbara B Artez Regis, MD  Multiple Vitamins-Minerals (EYE VITAMINS) CAPS Take 1 capsule by mouth daily.   Yes Historical Provider, MD  OVER THE COUNTER MEDICATION OTC PreserVision eye vitamin and mineral supplement taking one in the am and  one at night   Yes Historical Provider, MD  simvastatin (ZOCOR) 20 MG tablet Take 1 tablet (20 mg total) by mouth every evening. 12/19/12  Yes Maurice MarchBarbara B Kalianne Fetting, MD  tiZANidine (ZANAFLEX) 4 MG capsule Take 1 capsule (4 mg total) by mouth 3 (three) times daily as needed for muscle spasms. 09/04/12  Yes Genelle GatherMatthew Scott Babish, PA-C          PMHx, Surg Hx, Soc and Fam Hx reviewed.  ROS: As per HPI.  O: Filed Vitals:   04/17/13 1000  BP: 150/76  Pulse: 69  Temp: 98.9 F (37.2 C)  Resp: 16   GEN: In NAD; WN,WD. HENT: [place]?AT; EOMI w/ clear conj/sclerae. Otherwise unremarkable. COR: RRR. LUNGS: Normal resp rate and effort. SKIN: W&D; intact w/o erythema, jaundice or pallor. NEURO: A&O x 3; CNs intact. Nonfocal.  A/P: Hypertension- Stable and controlled on current medications. Continue the same.  Asthma- RX: Flovent MDI for daily use. Rinse mouth after use.  Meds ordered this encounter  Medications  . OVER THE COUNTER MEDICATION    Sig: OTC PreserVision eye vitamin and mineral supplement taking one in the am and one at night  . fluticasone (FLOVENT HFA) 110 MCG/ACT inhaler    Sig: Inhale 2 puffs into the lungs 2 (two) times daily.    Dispense:  1 Inhaler    Refill:  12  . lisinopril-hydrochlorothiazide (PRINZIDE,ZESTORETIC) 20-12.5 MG per tablet    Sig: Take 2 tablets by mouth every morning.    Dispense:  60 tablet  Refill:  5

## 2013-05-09 ENCOUNTER — Ambulatory Visit (INDEPENDENT_AMBULATORY_CARE_PROVIDER_SITE_OTHER): Payer: Medicare Other | Admitting: Family Medicine

## 2013-05-09 VITALS — BP 128/74 | HR 78 | Temp 99.0°F | Resp 16 | Ht 65.0 in | Wt 144.0 lb

## 2013-05-09 DIAGNOSIS — J45909 Unspecified asthma, uncomplicated: Secondary | ICD-10-CM

## 2013-05-09 MED ORDER — ALBUTEROL SULFATE HFA 108 (90 BASE) MCG/ACT IN AERS: 2.0000 | INHALATION_SPRAY | Freq: Four times a day (QID) | RESPIRATORY_TRACT | Status: DC | PRN

## 2013-05-09 NOTE — Patient Instructions (Addendum)
You have been diagnosed with asthma, a prescription has been given to you for an albuterol inhaler.  If you can not afford this medication, please call us and let us know.  Continue to take your regular medications, however you can increase the Atrovent nasal spray to 4 x per day while your symptoms continue.  Drink plenty of fluids and try to rest.

## 2013-05-09 NOTE — Progress Notes (Signed)
[facility] [address], [place] [address] [phone]  Date:  05/09/2013   Name:  Kathryn Gomez   DOB:  May 11, 1940   MRN:  [id number]  PCP:  Dow Adolph, MD    Chief Complaint: Shortness of Breath   History of Present Illness:  Kathryn Gomez is a 73 y.o. very pleasant female patient who presents with the following: started sneezing last night with white nasal discharge. Also believes that she "started to wheeze."  Woke this morning with extreme fatigue  and could not go to work this morning.  She works in Personnel officer and did not want to risk having rhinorrhea at work either.   Took temperature last night which was 100.0- she does not feel febrile.  Associated with a dry non productive cough.  No sick contacts.  Denies headache, sore throat, body aches, nausea, vomiting, and diarrhea.  Denies chest pain, palpitations.  States she ran out of albuterol. At last visit in March pt was changed from QVAR to flovent for cost reasons.  Pt is a non smoker but has suffered with asthma for 5-6 years.  Is exposed to second hand smoke in the house with grandson.   Does need a note for work today Patient Active Problem List   Diagnosis Date Noted  . Expected blood loss anemia 09/04/2012  . Overweight (BMI 25.0-29.9) 09/04/2012  . S/P left THA, AA 09/03/2012  . Chronic left hip pain 08/15/2012  . High cholesterol 03/26/2011  . Hypertension 03/26/2011  . Asthma 03/26/2011    Past Medical History  Diagnosis Date  . Asthma   . Allergy   . Cataract     bilateral  . Hypertension   . Hypercholesteremia   . Pneumonia     hx of  . Arthritis     Past Surgical History  Procedure Laterality Date  . Abdominal hysterectomy  Feb 1976    Pelvic pain (no cancer)  . Fracture surgery Right 20 years ago    two fingers  . Total hip arthroplasty Left 09/03/2012    Procedure: LEFT TOTAL HIP ARTHROPLASTY ANTERIOR APPROACH;  Surgeon: Shelda Pal, MD;  Location: [facility];   Service: Orthopedics;  Laterality: Left;    History  Substance Use Topics  . Smoking status: Never Smoker   . Smokeless tobacco: Never Used  . Alcohol Use: No    Family History  Problem Relation Age of Onset  . Hypertension Mother   . Hypertension Maternal Grandmother   . Hypertension Brother   . Obesity Brother     No Known Allergies  Medication list has been reviewed and updated.  Current Outpatient Prescriptions on File Prior to Visit  Medication Sig Dispense Refill  . albuterol (PROVENTIL HFA;VENTOLIN HFA) 108 (90 BASE) MCG/ACT inhaler Inhale 2 puffs into the lungs every 4 (four) hours as needed for wheezing or shortness of breath (cough, shortness of breath or wheezing.).  1 Inhaler  5  . amLODipine (NORVASC) 5 MG tablet Take 1 tablet (5 mg total) by mouth every morning.  30 tablet  5  . docusate sodium 100 MG CAPS Take 100 mg by mouth 2 (two) times daily.  10 capsule  0  . fluticasone (FLOVENT HFA) 110 MCG/ACT inhaler Inhale 2 puffs into the lungs 2 (two) times daily.  1 Inhaler  12  . ipratropium (ATROVENT) 0.03 % nasal spray PLACE 2 SPRAYS INTO THE NOSE EVERY 12 (TWELVE) HOURS.  30 mL  10  . lisinopril-hydrochlorothiazide (  PRINZIDE,ZESTORETIC) 20-12.5 MG per tablet Take 2 tablets by mouth every morning.  60 tablet  5  . Multiple Vitamins-Minerals (EYE VITAMINS) CAPS Take 1 capsule by mouth daily.      Marland Kitchen. OVER THE COUNTER MEDICATION OTC PreserVision eye vitamin and mineral supplement taking one in the am and one at night      . simvastatin (ZOCOR) 20 MG tablet Take 1 tablet (20 mg total) by mouth every evening.  30 tablet  5  . tiZANidine (ZANAFLEX) 4 MG capsule Take 1 capsule (4 mg total) by mouth 3 (three) times daily as needed for muscle spasms.  50 capsule  0   No current facility-administered medications on file prior to visit.    Review of Systems: Gen: admits fever, denies chills, night sweats, wt loss  Cardio - denies palpitations and chest pain ENT- admits dry  non productive cough, admits nasal discharge, denies sore throat  Resp- denies SOB, admits wheeze last night but none today Ab - denies ab pain, diarrhea, constipation gu- denies trouble w urination   Physical Examination: Filed Vitals:   05/09/13 1208  BP: 128/74  Pulse: 78  Temp: 99 F (37.2 C)  Resp: 16   Filed Vitals:   05/09/13 1208  Height: 5\' 5"  (1.651 m)  Weight: 144 lb (65.318 kg)   Body mass index is 23.96 kg/(m^2). Ideal Body Weight: Weight in (lb) to have BMI = 25: 149.9  GEN: WDWN, NAD, Non-toxic, A & O x 3, looks well HEENT: Atraumatic, Normocephalic. Neck supple. No masses, No LAD. No erythemata no exudates in pharynx Ears and Nose: No external deformity. Mild erythema and swelling in the nasal turbinates.  No bulging of tympanic membrane.   CV: RRR, No M/G/R. No JVD. No thrill. No extra heart sounds. PULM: CTA B, no wheezes, crackles, rhonchi. No retractions. No resp. distress. No accessory muscle use. EXTR: No c/c/e. 5/5 strength in upper and lw extremities.  NEURO Normal gait. II-XII cn intact.   PSYCH: Normally interactive. Conversant. Not depressed or anxious appearing.  Calm demeanor.    Assessment and Plan: Asthma - Plan: albuterol (PROVENTIL HFA;VENTOLIN HFA) 108 (90 BASE) MCG/ACT inhaler  Asthma with mild exacerbation, no wheezing now. Needs her albuterol refilled and a note for work.  Did these things for her today. Asked her to let me know if she does not feel better in the next few days.  May increase her atrovent nasal use while she has rhinorrhea.  Signed Abbe AmsterdamJessica Copland, MD

## 2013-07-30 ENCOUNTER — Telehealth: Payer: Self-pay | Admitting: Radiology

## 2013-07-30 NOTE — Telephone Encounter (Signed)
Note, will address at office visit, I am out of the office, will make sure physician aware.

## 2013-07-30 NOTE — Telephone Encounter (Signed)
Message copied by Caffie DammeLITTRELL, Jailani Hogans W on Wed Jul 30, 2013 11:01 AM ------      Message from: Mathews ArgyleMALPASS, DANA S      Created: Wed Jul 30, 2013 10:29 AM      Regarding: HQPAF       [name] has an upcoming scheduled appointment for 08/22/13 @ 1045.  Part of her PAF early detection chronic illnesses/screenings to consider is DM secondary to her morbid obesity diagnosis (BMI 25-29).              Also, she is missing her breast cancer screening (due to her turning 73 yo in November, she may become exempt)...            If you have any further questions or concerns, please don't hesitate to contact me.            Thank you,      Mathews Argyleana Malpass, RN ------

## 2013-08-22 ENCOUNTER — Ambulatory Visit: Payer: Medicare Other | Admitting: Family Medicine

## 2013-08-23 ENCOUNTER — Other Ambulatory Visit: Payer: Self-pay | Admitting: Family Medicine

## 2013-08-27 ENCOUNTER — Other Ambulatory Visit: Payer: Self-pay | Admitting: Family Medicine

## 2013-09-24 ENCOUNTER — Other Ambulatory Visit: Payer: Self-pay | Admitting: Family Medicine

## 2013-09-25 ENCOUNTER — Encounter: Payer: Self-pay | Admitting: Family Medicine

## 2013-09-25 ENCOUNTER — Ambulatory Visit (INDEPENDENT_AMBULATORY_CARE_PROVIDER_SITE_OTHER): Payer: Medicare Other | Admitting: Family Medicine

## 2013-09-25 VITALS — BP 130/70 | HR 68 | Temp 98.2°F | Resp 16 | Ht 64.75 in | Wt 145.6 lb

## 2013-09-25 DIAGNOSIS — I10 Essential (primary) hypertension: Secondary | ICD-10-CM

## 2013-09-25 DIAGNOSIS — Z760 Encounter for issue of repeat prescription: Secondary | ICD-10-CM

## 2013-09-25 MED ORDER — IPRATROPIUM BROMIDE 0.03 % NA SOLN: NASAL | Status: DC

## 2013-09-25 MED ORDER — LISINOPRIL-HYDROCHLOROTHIAZIDE 20-12.5 MG PO TABS: 2.0000 | ORAL_TABLET | Freq: Every morning | ORAL | Status: DC

## 2013-09-25 MED ORDER — AMLODIPINE BESYLATE 5 MG PO TABS: ORAL_TABLET | ORAL | Status: DC

## 2013-09-25 MED ORDER — ZOSTER VACCINE LIVE 19400 UNT/0.65ML ~~LOC~~ SOLR: 0.6500 mL | Freq: Once | SUBCUTANEOUS | Status: DC

## 2013-09-25 MED ORDER — SIMVASTATIN 20 MG PO TABS: ORAL_TABLET | ORAL | Status: DC

## 2013-09-26 NOTE — Progress Notes (Signed)
S:  This 73 y.o. AA female has stable, well controlled HTN; medication compliance is excellent w/o adverse effects. Pt denies fatigue, diaphoresis, vision disturbances, CP or tightness, palpitations, edema, SOB or DOE, cough, HA, dizziness, numbness, weakness or syncope. She still enjoys working at [employer].   Pt has Asthma; recent medication change- QVAR not covered by insurer (too costly). Steroid MDI changed to Flovent; this is effective for pt. She uses Albuterol infrequently.  Pt has not received Zostavax (too expensive) but will check on cost.  Patient Active Problem List   Diagnosis Date Noted  . Expected blood loss anemia 09/04/2012  . Overweight (BMI 25.0-29.9) 09/04/2012  . S/P left THA, AA 09/03/2012  . High cholesterol 03/26/2011  . Hypertension 03/26/2011  . Asthma 03/26/2011    Prior to Admission medications   Medication Sig Start Date End Date Taking? Authorizing Provider  albuterol (PROVENTIL HFA;VENTOLIN HFA) 108 (90 BASE) MCG/ACT inhaler Inhale 2 puffs into the lungs every 6 (six) hours as needed for wheezing or shortness of breath. 05/09/13  Yes Jessica C Copland, MD  amLODipine (NORVASC) 5 MG tablet TAKE 1 TABLET BY MOUTH EVERY DAY . NEEDS APPOINTMENT FOR REFILLS.   Yes Maurice March, MD  aspirin 81 MG tablet Take 81 mg by mouth daily.   Yes Historical Provider, MD  ipratropium (ATROVENT) 0.03 % nasal spray PLACE 2 SPRAYS INTO THE NOSE EVERY 12 (TWELVE) HOURS.   Yes Maurice March, MD  lisinopril-hydrochlorothiazide (PRINZIDE,ZESTORETIC) 20-12.5 MG per tablet Take 2 tablets by mouth every morning.   Yes Maurice March, MD  Multiple Vitamins-Minerals (EYE VITAMINS) CAPS Take 1 capsule by mouth daily.   Yes Historical Provider, MD  OVER THE COUNTER MEDICATION OTC PreserVision eye vitamin and mineral supplement taking one in the am and one at night   Yes Historical Provider, MD  simvastatin (ZOCOR) 20 MG tablet TAKE 1 TABLET BY MOUTH DAILY   Yes Maurice March, MD  docusate sodium 100 MG CAPS Take 100 mg by mouth 2 (two) times daily. 09/04/12   Genelle Gather Babish, PA-C  fluticasone (FLOVENT HFA) 110 MCG/ACT inhaler Inhale 2 puffs into the lungs 2 (two) times daily. 04/17/13   Maurice March, MD  zoster vaccine live, PF, (ZOSTAVAX) 16109 UNT/0.65ML injection Inject 19,400 Units into the skin once. 09/25/13   Maurice March, MD    History   Social History  . Marital Status: Widowed    Spouse Name: N/A    Number of Children: N/A  . 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 Smoking status: Never Smoker   . Smokeless tobacco: Never Used  . Alcohol Use: No  . Drug Use: No  . Sexual Activity: Not on file   Other Topics Concern  . Not on file   Social History Narrative   Does not exercise.    ROS: As per HPI.  O: Filed Vitals:   09/25/13 1553  BP: 130/70  Pulse: 68  Temp: 98.2 F (36.8 C)  Resp: 16   GEN: In NAD; WN,WD.  HENT: [place]/AT; EOMI w/ clear conj/sclerae. Otherwise unremarkable. COR: RRR. No edema. LUNGS: Unlabored resp. No wheezes or use of accessory muscles. SKIN: W&D; intact w/o erythema, diaphoresis or pallor. NEURO: A&O x3; CNs intact. Gait normal. Nonfocal.  A/P: Essential hypertension- Stable on current medications.  Issue of repeat prescriptions  Meds ordered this encounter  Medications  . aspirin 81 MG tablet    Sig: Take 81 mg by mouth daily.  Marland Kitchen amLODipine (NORVASC) 5 MG tablet    Sig: TAKE 1 TABLET BY MOUTH EVERY DAY . NEEDS APPOINTMENT FOR REFILLS.    Dispense:  30 tablet    Refill:  11  . ipratropium (ATROVENT) 0.03 % nasal spray    Sig: PLACE 2 SPRAYS INTO THE NOSE EVERY 12 (TWELVE) HOURS.    Dispense:  30 mL    Refill:  11  . lisinopril-hydrochlorothiazide (PRINZIDE,ZESTORETIC) 20-12.5 MG per tablet    Sig: Take 2 tablets by mouth every morning.    Dispense:  60 tablet    Refill:  11  . simvastatin  (ZOCOR) 20 MG tablet    Sig: TAKE 1 TABLET BY MOUTH DAILY    Dispense:  30 tablet    Refill:  11  . zoster vaccine live, PF, (ZOSTAVAX) 16109 UNT/0.65ML injection    Sig: Inject 19,400 Units into the skin once.    Dispense:  1 each    Refill:  0

## 2013-11-27 ENCOUNTER — Encounter: Payer: Self-pay | Admitting: Family Medicine

## 2013-11-27 ENCOUNTER — Other Ambulatory Visit: Payer: Self-pay | Admitting: Family Medicine

## 2013-11-27 ENCOUNTER — Ambulatory Visit (INDEPENDENT_AMBULATORY_CARE_PROVIDER_SITE_OTHER): Payer: Medicare Other | Admitting: Family Medicine

## 2013-11-27 VITALS — BP 131/72 | HR 82 | Temp 98.3°F | Resp 16 | Ht 64.5 in | Wt 147.0 lb

## 2013-11-27 DIAGNOSIS — Z0000 Encounter for general adult medical examination without abnormal findings: Secondary | ICD-10-CM

## 2013-11-27 DIAGNOSIS — E119 Type 2 diabetes mellitus without complications: Secondary | ICD-10-CM

## 2013-11-27 DIAGNOSIS — R350 Frequency of micturition: Secondary | ICD-10-CM

## 2013-11-27 DIAGNOSIS — I10 Essential (primary) hypertension: Secondary | ICD-10-CM

## 2013-11-27 DIAGNOSIS — Z23 Encounter for immunization: Secondary | ICD-10-CM

## 2013-11-27 LAB — COMPLETE METABOLIC PANEL WITH GFR
ALBUMIN: 3.9 g/dL (ref 3.5–5.2)
ALK PHOS: 71 U/L (ref 39–117)
ALT: 9 U/L (ref 0–35)
AST: 17 U/L (ref 0–37)
BUN: 29 mg/dL — ABNORMAL HIGH (ref 6–23)
CO2: 21 meq/L (ref 19–32)
Calcium: 9.2 mg/dL (ref 8.4–10.5)
Chloride: 111 mEq/L (ref 96–112)
Creat: 1.15 mg/dL — ABNORMAL HIGH (ref 0.50–1.10)
GFR, EST AFRICAN AMERICAN: 55 mL/min — AB
GFR, EST NON AFRICAN AMERICAN: 47 mL/min — AB
GLUCOSE: 95 mg/dL (ref 70–99)
POTASSIUM: 4.4 meq/L (ref 3.5–5.3)
SODIUM: 142 meq/L (ref 135–145)
TOTAL PROTEIN: 6.5 g/dL (ref 6.0–8.3)
Total Bilirubin: 0.4 mg/dL (ref 0.2–1.2)

## 2013-11-27 LAB — LIPID PANEL
CHOL/HDL RATIO: 3.3 ratio
Cholesterol: 133 mg/dL (ref 0–200)
HDL: 40 mg/dL (ref >39)
LDL CALC: 79 mg/dL (ref 0–99)
Triglycerides: 69 mg/dL (ref <150)
VLDL: 14 mg/dL (ref 0–40)

## 2013-11-27 LAB — POCT URINALYSIS DIPSTICK
Bilirubin, UA: NEGATIVE
Blood, UA: NEGATIVE
GLUCOSE UA: NEGATIVE
KETONES UA: NEGATIVE
Nitrite, UA: NEGATIVE
SPEC GRAV UA: 1.015
Urobilinogen, UA: 0.2
pH, UA: 5.5

## 2013-11-27 MED ORDER — FESOTERODINE FUMARATE ER 4 MG PO TB24: ORAL_TABLET | ORAL | Status: DC

## 2013-11-27 NOTE — Progress Notes (Signed)
Subjective:    Patient ID: Kathryn Gomez, female    DOB: [birth year], 73 y.o.   MRN: [id number]  HPI  This 73 y.o. AA female [facility]. She has well controlled HTN and is complaint w/ medications w/o adverse effects.  Patient Active Problem List   Diagnosis Date Noted  . Expected blood loss anemia 09/04/2012  . Overweight (BMI 25.0-29.9) 09/04/2012  . S/P left THA, AA 09/03/2012  . High cholesterol 03/26/2011  . Hypertension 03/26/2011  . Asthma 03/26/2011    Prior to Admission medications   Medication Sig Start Date End Date Taking? Authorizing Provider  albuterol (PROVENTIL HFA;VENTOLIN HFA) 108 (90 BASE) MCG/ACT inhaler Inhale 2 puffs into the lungs every 6 (six) hours as needed for wheezing or shortness of breath. 05/09/13  Yes Jessica C Copland, MD  amLODipine (NORVASC) 5 MG tablet TAKE 1 TABLET BY MOUTH EVERY DAY . NEEDS APPOINTMENT FOR REFILLS. 09/25/13  Yes Maurice MarchBarbara B Jencarlos Nicolson, MD  aspirin 81 MG tablet Take 81 mg by mouth daily.   Yes Historical Provider, MD  fluticasone (FLOVENT HFA) 110 MCG/ACT inhaler Inhale 2 puffs into the lungs 2 (two) times daily. 04/17/13  Yes Maurice MarchBarbara B Jerzie Bieri, MD  ipratropium (ATROVENT) 0.03 % nasal spray PLACE 2 SPRAYS INTO THE NOSE EVERY 12 (TWELVE) HOURS. 09/25/13  Yes Maurice MarchBarbara B Malesha Suliman, MD  lisinopril-hydrochlorothiazide [facility](PRINZIDE,ZESTORETIC) 20-12.5 MG per tablet Take 2 tablets by mouth every morning. 09/25/13  Yes Maurice MarchBarbara B Olivianna Higley, MD  Multiple Vitamins-Minerals (EYE VITAMINS) CAPS Take 1 capsule by mouth daily.   Yes Historical Provider, MD  OVER THE COUNTER MEDICATION OTC PreserVision eye vitamin and mineral supplement taking one in the am and one at night   Yes Historical Provider, MD  simvastatin (ZOCOR) 20 MG tablet TAKE 1 TABLET BY MOUTH DAILY 09/25/13  Yes Maurice MarchBarbara B Milynn Quirion, MD  docusate sodium 100 MG CAPS Take 100 mg by mouth 2 (two) times daily. 09/04/12   Genelle GatherMatthew Scott Babish, PA-C  zoster vaccine  live, PF, (ZOSTAVAX) [id number] UNT/0.65ML injection Inject 19,400 Units into the skin once. 09/25/13   Maurice MarchBarbara B Temple Ewart, MD    History   Social History  . Marital Status: Widowed    Spouse Name: N/A    Number of Children: N/A  . 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 Smoking status: Never Smoker   . Smokeless tobacco: Never Used  . Alcohol Use: No  . Drug Use: No  . Sexual Activity: Not on file   Other Topics Concern  . Not on file   Social History Narrative   Does not exercise. Education: High School    Family History  Problem Relation Age of Onset  . Hypertension Mother   . Hypertension Maternal Grandmother   . Hypertension Brother   . Obesity Brother     Review of Systems  Constitutional: Negative.   HENT: Negative.   Eyes: Positive for visual disturbance.  Respiratory: Positive for cough.   Cardiovascular: Negative.   Gastrointestinal: Negative.   Endocrine: Negative.   Genitourinary: Positive for frequency. Negative for dysuria, hematuria, decreased urine volume, difficulty urinating and pelvic pain.  Musculoskeletal: Negative.   Skin: Negative.   Allergic/Immunologic: Negative.   Neurological: Negative.   Hematological: Negative.   Psychiatric/Behavioral: Negative.       Objective:   Physical Exam  Constitutional: She is oriented to person, place, and  time. Vital signs are normal. She appears well-developed and well-nourished. No distress.  HENT:  Head: Normocephalic and atraumatic.  Right Ear: Hearing, tympanic membrane, external ear and ear canal normal.  Left Ear: Hearing, tympanic membrane, external ear and ear canal normal.  Nose: Nose normal. No nasal deformity or septal deviation.  Mouth/Throat: Uvula is midline, oropharynx is clear and moist and mucous membranes are normal. No oral lesions. Normal dentition.  Eyes: Conjunctivae, EOM and lids are normal. Pupils are  equal, round, and reactive to light. No scleral icterus.  Fundoscopic exam:      The right eye shows no papilledema. The right eye shows red reflex.       The left eye shows no papilledema. The left eye shows red reflex.  Wears corrective lenses.  Neck: Trachea normal, normal range of motion, full passive range of motion without pain and phonation normal. Neck supple. No JVD present. No spinous process tenderness and no muscular tenderness present. Carotid bruit is not present. No thyroid mass and no thyromegaly present.  Cardiovascular: Normal rate, regular rhythm, S1 normal, S2 normal, normal heart sounds, intact distal pulses and normal pulses.   No extrasystoles are present. PMI is not displaced.  Exam reveals no gallop and no friction rub.   No murmur heard. Pulmonary/Chest: Effort normal and breath sounds normal. No respiratory distress. She has no decreased breath sounds. She has no wheezes. She has no rales. Right breast exhibits no inverted nipple, no mass, no nipple discharge, no skin change and no tenderness. Left breast exhibits no inverted nipple, no mass, no nipple discharge, no skin change and no tenderness. Breasts are symmetrical.  Abdominal: Soft. Normal appearance. She exhibits no distension, no abdominal bruit, no pulsatile midline mass and no mass. There is no hepatosplenomegaly. There is no tenderness. There is no guarding and no CVA tenderness.  Genitourinary:  Deferred.  Musculoskeletal:       Cervical back: Normal.       Thoracic back: Normal.       Lumbar back: Normal.  Remainder of exam unremarkable.  Lymphadenopathy:       Head (right side): No submental, no submandibular, no tonsillar, no preauricular, no posterior auricular and no occipital adenopathy present.       Head (left side): No submental, no submandibular, no tonsillar, no preauricular, no posterior auricular and no occipital adenopathy present.    She has no cervical adenopathy.    She has no axillary  adenopathy.       Right: No inguinal and no supraclavicular adenopathy present.       Left: No inguinal and no supraclavicular adenopathy present.  Neurological: She is alert and oriented to person, place, and time. She has normal strength and normal reflexes. She displays no atrophy. No cranial nerve deficit or sensory deficit. She exhibits normal muscle tone. She displays a negative Romberg sign. Coordination and gait normal.  Get Up and Go test: time= 9 sec.  Skin: Skin is warm, dry and intact. No ecchymosis, no lesion and no rash noted. She is not diaphoretic. No cyanosis or erythema. No pallor. Nails show no clubbing.  Psychiatric: She has a normal mood and affect. Her speech is normal and behavior is normal. Judgment and thought content normal. Cognition and memory are normal.  Nursing note and vitals reviewed.      Assessment & Plan:  Medicare annual wellness visit, subsequent -   Essential hypertension - Stable and controlled on current medications. Plan: POCT urinalysis dipstick,  COMPLETE METABOLIC PANEL WITH GFR, Lipid panel  Urinary frequency- Trial of Toviaz 5 mg  1 tablet every evening.  Need for prophylactic vaccination and inoculation against influenza - Plan: Flu Vaccine QUAD 36+ mos IM  Meds ordered this encounter  Medications  . fesoterodine (TOVIAZ) 4 MG TB24 tablet    Sig: Take 1 tablet by mouth in the evening.    Dispense:  30 tablet    Refill:  3

## 2013-11-27 NOTE — Patient Instructions (Signed)

## 2013-12-01 ENCOUNTER — Encounter: Payer: Self-pay | Admitting: Family Medicine

## 2013-12-06 NOTE — Progress Notes (Signed)
Quick Note:  Please advise pt regarding following labs...  Kidney function labs are slightly increased. Improve hydration (drink more water and limit protein in diet) and have labs rechecked in 3-6 months.  Copy to pt. ______

## 2014-02-05 DIAGNOSIS — H25011 Cortical age-related cataract, right eye: Secondary | ICD-10-CM | POA: Diagnosis not present

## 2014-02-05 DIAGNOSIS — I10 Essential (primary) hypertension: Secondary | ICD-10-CM | POA: Diagnosis not present

## 2014-02-05 DIAGNOSIS — H2511 Age-related nuclear cataract, right eye: Secondary | ICD-10-CM | POA: Diagnosis not present

## 2014-02-05 DIAGNOSIS — H25041 Posterior subcapsular polar age-related cataract, right eye: Secondary | ICD-10-CM | POA: Diagnosis not present

## 2014-02-24 ENCOUNTER — Other Ambulatory Visit: Payer: Self-pay

## 2014-02-24 MED ORDER — IPRATROPIUM BROMIDE 0.03 % NA SOLN
NASAL | Status: DC
Start: 2014-02-24 — End: 2015-02-27

## 2014-02-24 MED ORDER — AMLODIPINE BESYLATE 5 MG PO TABS: ORAL_TABLET | ORAL | Status: DC

## 2014-02-24 MED ORDER — SIMVASTATIN 20 MG PO TABS: ORAL_TABLET | ORAL | Status: DC

## 2014-02-24 MED ORDER — LISINOPRIL-HYDROCHLOROTHIAZIDE 20-12.5 MG PO TABS: 2.0000 | ORAL_TABLET | Freq: Every morning | ORAL | Status: DC

## 2014-02-24 NOTE — Addendum Note (Signed)
Addended by: Sheppard PlumberBRIGGS, Harryette Shuart A on: 02/24/2014 04:02 PM   Modules accepted: Orders

## 2014-02-24 NOTE — Telephone Encounter (Signed)
Pharm req'd 90 day supplies. I re-sent in remaining 6 mos of RFs in 90 day RFs.

## 2014-02-24 NOTE — Telephone Encounter (Signed)
Pharm req'd 90 day RFs. Sent remaining 6 mos of RFs in as 90 day supplies.

## 2014-03-26 ENCOUNTER — Ambulatory Visit: Payer: Medicare Other | Admitting: Family Medicine

## 2014-04-02 ENCOUNTER — Encounter: Payer: Self-pay | Admitting: Family Medicine

## 2014-04-02 ENCOUNTER — Ambulatory Visit (INDEPENDENT_AMBULATORY_CARE_PROVIDER_SITE_OTHER): Payer: Medicare Other | Admitting: Family Medicine

## 2014-04-02 VITALS — BP 151/74 | HR 63 | Temp 98.1°F | Resp 16 | Ht 64.5 in | Wt 148.0 lb

## 2014-04-02 DIAGNOSIS — R197 Diarrhea, unspecified: Secondary | ICD-10-CM | POA: Diagnosis not present

## 2014-04-02 DIAGNOSIS — J4530 Mild persistent asthma, uncomplicated: Secondary | ICD-10-CM

## 2014-04-02 DIAGNOSIS — I10 Essential (primary) hypertension: Secondary | ICD-10-CM | POA: Diagnosis not present

## 2014-04-02 MED ORDER — CVS PROBIOTIC MAXIMUM STRENGTH PO CAPS: 1.0000 | ORAL_CAPSULE | Freq: Every day | ORAL | Status: AC

## 2014-04-02 NOTE — Patient Instructions (Signed)
Food Choices to Help Relieve Diarrhea When you have diarrhea, the foods you eat and your eating habits are very important. Choosing the right foods and drinks can help relieve diarrhea. Also, because diarrhea can last up to 7 days, you need to replace lost fluids and electrolytes (such as sodium, potassium, and chloride) in order to help prevent dehydration.  WHAT GENERAL GUIDELINES DO I NEED TO FOLLOW?  Slowly drink 1 cup (8 oz) of fluid for each episode of diarrhea. If you are getting enough fluid, your urine will be clear or pale yellow.  Eat starchy foods. Some good choices include white rice, white toast, pasta, low-fiber cereal, baked potatoes (without the skin), saltine crackers, and bagels.  Avoid large servings of any cooked vegetables.  Limit fruit to two servings per day. A serving is  cup or 1 small piece.  Choose foods with less than 2 g of fiber per serving.  Limit fats to less than 8 tsp (38 g) per day.  Avoid fried foods.  Eat foods that have probiotics in them. Probiotics can be found in certain dairy products.  Avoid foods and beverages that may increase the speed at which food moves through the stomach and intestines (gastrointestinal tract). Things to avoid include:  High-fiber foods, such as dried fruit, raw fruits and vegetables, nuts, seeds, and whole grain foods.  Spicy foods and high-fat foods.  Foods and beverages sweetened with high-fructose corn syrup, honey, or sugar alcohols such as xylitol, sorbitol, and mannitol. WHAT FOODS ARE RECOMMENDED? Grains White rice. White, [place], or pita breads (fresh or toasted), including plain rolls, buns, or bagels. White pasta. Saltine, soda, or graham crackers. Pretzels. Low-fiber cereal. Cooked cereals made with water (such as cornmeal, farina, or cream cereals). Plain muffins. Matzo. Melba toast. Zwieback.  Vegetables Potatoes (without the skin). Strained tomato and vegetable juices. Most well-cooked and canned  vegetables without seeds. Tender lettuce. Fruits Cooked or canned applesauce, apricots, cherries, fruit cocktail, grapefruit, peaches, pears, or plums. Fresh bananas, apples without skin, cherries, grapes, cantaloupe, grapefruit, peaches, oranges, or plums.  Meat and Other Protein Products Baked or boiled chicken. Eggs. Tofu. Fish. Seafood. Smooth peanut butter. Ground or well-cooked tender beef, ham, veal, lamb, pork, or poultry.  Dairy Plain yogurt, kefir, and unsweetened liquid yogurt. Lactose-free milk, buttermilk, or soy milk. Plain hard cheese. Beverages Sport drinks. Clear broths. Diluted fruit juices (except prune). Regular, caffeine-free sodas such as ginger ale. Water. Decaffeinated teas. Oral rehydration solutions. Sugar-free beverages not sweetened with sugar alcohols. Other Bouillon, broth, or soups made from recommended foods.  The items listed above may not be a complete list of recommended foods or beverages. Contact your dietitian for more options. WHAT FOODS ARE NOT RECOMMENDED? Grains Whole grain, whole wheat, bran, or rye breads, rolls, pastas, crackers, and cereals. Wild or brown rice. Cereals that contain more than 2 g of fiber per serving. Corn tortillas or taco shells. Cooked or dry oatmeal. Granola. Popcorn. Vegetables Raw vegetables. Cabbage, broccoli, Brussels sprouts, artichokes, baked beans, beet greens, corn, kale, legumes, peas, sweet potatoes, and yams. Potato skins. Cooked spinach and cabbage. Fruits Dried fruit, including raisins and dates. Raw fruits. Stewed or dried prunes. Fresh apples with skin, apricots, mangoes, pears, raspberries, and strawberries.  Meat and Other Protein Products Chunky peanut butter. Nuts and seeds. Beans and lentils. Berniece Salines.  Dairy H  igh-fat cheeses. Milk, chocolate milk, and beverages made with milk, such as milk shakes. Cream. Ice cream. Sweets and Desserts Sweet rolls, doughnuts, and sweet  breads. Pancakes and waffles. Fats  and Oils Butter. Cream sauces. Margarine. Salad oils. Plain salad dressings. Olives. Avocados.  Beverages Caffeinated beverages (such as coffee, tea, soda, or energy drinks). Alcoholic beverages. Fruit juices with pulp. Prune juice. Soft drinks sweetened with high-fructose corn syrup or sugar alcohols. Other Coconut. Hot sauce. Chili powder. Mayonnaise. Gravy. Cream-based or milk-based soups.  The items listed above may not be a complete list of foods and beverages to avoid. Contact your dietitian for more information. WHAT SHOULD I DO IF I BECOME DEHYDRATED? Diarrhea can sometimes lead to dehydration. Signs of dehydration include dark urine and dry mouth and skin. If you think you are dehydrated, you should rehydrate with an oral rehydration solution. These solutions can be purchased at pharmacies, retail stores, or online.  Drink -1 cup (120-240 mL) of oral rehydration solution each time you have an episode of diarrhea. If drinking this amount makes your diarrhea worse, try drinking smaller amounts more often. For example, drink 1-3 tsp (5-15 mL) every 5-10 minutes.  A general rule for staying hydrated is to drink 1-2 L of fluid per day. Talk to your health care provider about the specific amount you should be drinking each day. Drink enough fluids to keep your urine clear or pale yellow. Document Released: 04/08/2003 Document Revised: 01/21/2013 Document Reviewed: 12/09/2012 [facility] Patient Information 2015 [place], [place]. This information is not intended to replace advice given to you by your health care provider. Make sure you discuss any questions you have with your health care provider.    I want you to try a PROBIOTIC- ask your pharmacist what is a good reasonably priced brand (Culturelle is a good choice but [facility] may have a store brand that is not costly).

## 2014-04-05 NOTE — Progress Notes (Signed)
Subjective:    Patient ID: Kathryn Gomez, female    DOB: [birth year], 74 y.o.   MRN: [id number]  HPI  This 74 y.o. Female has well controlled HTN; she is compliant w/ medication and has normal BP measurements elsewhere. Pt works and denies diaphoresis, fatigue, vision disturbances, CP or tightness, palpitations, edema, SOB or DOE, HA, dizziness, weakness, numbness or syncope.  Pt has mild Asthma and uses Fluticasone MDI once daily; She rarely uses rescue inhaler. She denies SOB or DOE, NP cough or nocturnal symptoms.   Pt c/o diarrhea (watery or loose stools) most days of the week for several months. It is unrelated to any particular foods. Stools are not melanotic nor is there blood visible. No hx of recent antibiotic use. Colonoscopy performed in 2014 was remarkable for polyp; pt states she was advised to return in 2 years for repeat procedure. She has not been contacted by GI specialist to schedule. She denies abd pain or bloating but does have some mild nausea w/ very mild heartburn. Appetite is good; she has not lost any weight.  Patient Active Problem List   Diagnosis Date Noted  . Expected blood loss anemia 09/04/2012  . Overweight (BMI 25.0-29.9) 09/04/2012  . S/P left THA, AA 09/03/2012  . High cholesterol 03/26/2011  . Hypertension 03/26/2011  . Asthma 03/26/2011    Prior to Admission medications   Medication Sig Start Date End Date Taking? Authorizing Provider  albuterol (PROVENTIL HFA;VENTOLIN HFA) 108 (90 BASE) MCG/ACT inhaler Inhale 2 puffs into the lungs every 6 (six) hours as needed for wheezing or shortness of breath. 05/09/13  Yes Gwenlyn Found Copland, MD  amLODipine (NORVASC) 5 MG tablet TAKE 1 TABLET BY MOUTH EVERY DAY . 02/24/14  Yes Maurice March, MD  aspirin 81 MG tablet Take 81 mg by mouth daily.   Yes Historical Provider, MD  docusate sodium 100 MG CAPS Take 100 mg by mouth 2 (two) times daily. 09/04/12  Yes Genelle Gather Babish, PA-C  fluticasone (FLOVENT HFA) 110  MCG/ACT inhaler Inhale 2 puffs into the lungs 2 (two) times daily. 04/17/13  Yes Maurice March, MD  ipratropium (ATROVENT) 0.03 % nasal spray PLACE 2 SPRAYS INTO THE NOSE EVERY 12 (TWELVE) HOURS. 02/24/14  Yes Maurice March, MD  lisinopril-hydrochlorothiazide (PRINZIDE,ZESTORETIC) 20-12.5 MG per tablet Take 2 tablets by mouth every morning. 02/24/14  Yes Maurice March, MD  Multiple Vitamins-Minerals (EYE VITAMINS) CAPS Take 1 capsule by mouth daily.   Yes Historical Provider, MD  OVER THE COUNTER MEDICATION OTC PreserVision eye vitamin and mineral supplement taking one in the am and one at night   Yes Historical Provider, MD  simvastatin (ZOCOR) 20 MG tablet TAKE 1 TABLET BY MOUTH DAILY 02/24/14  Yes Maurice March, MD    Past Surgical History  Procedure Laterality Date  . Abdominal hysterectomy  Feb 1976    Pelvic pain (no cancer)  . Fracture surgery Right 20 years ago    two fingers  . Total hip arthroplasty Left 09/03/2012    Procedure: LEFT TOTAL HIP ARTHROPLASTY ANTERIOR APPROACH;  Surgeon: Shelda Pal, MD;  Location: [facility];  Service: Orthopedics;  Laterality: Left;    History   Social History  . Marital Status: Widowed    Spouse Name: N/A  . Number of Children: N/A  . 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 Smoking status: Never  Smoker   . Smokeless tobacco: Never Used  . Alcohol Use: No  . Drug Use: No  . Sexual Activity: Not on file   Other Topics Concern  . Not on file   Social History Narrative   Does not exercise. Education: High School    Family History  Problem Relation Age of Onset  . Hypertension Mother   . Hypertension Maternal Grandmother   . Hypertension Brother   . Obesity Brother     Review of Systems  Constitutional: Negative for fever, chills, appetite change and unexpected weight change.  HENT: Negative for mouth sores, sore throat and trouble  swallowing.   Eyes: Negative for visual disturbance.  Respiratory: Negative.   Cardiovascular: Negative.   Gastrointestinal:       As per HPI.  Genitourinary: Negative.   Musculoskeletal: Negative for myalgias and arthralgias.  Skin: Negative.   Neurological: Negative.   Psychiatric/Behavioral: Negative.        Objective:   Physical Exam  Constitutional: She is oriented to person, place, and time. She appears well-developed and well-nourished. No distress.  Blood pressure 151/74, pulse 63, temperature 98.1 F (36.7 C), temperature source Oral, resp. rate 16, height 5' 4.5" (1.638 m), weight 148 lb (67.132 kg), SpO2 99 %.   HENT:  Head: Normocephalic and atraumatic.  Right Ear: External ear normal.  Left Ear: External ear normal.  Nose: Nose normal.  Mouth/Throat: Oropharynx is clear and moist.  Eyes: Conjunctivae and EOM are normal. No scleral icterus.  Neck: Normal range of motion. Neck supple. No thyromegaly present.  Cardiovascular: Normal rate, regular rhythm and normal heart sounds.   No murmur heard. Pulmonary/Chest: Effort normal and breath sounds normal. No respiratory distress. She has no wheezes.  Abdominal: Soft. Bowel sounds are normal. She exhibits no distension and no mass. There is no tenderness. There is no guarding.  Musculoskeletal: Normal range of motion. She exhibits no edema or tenderness.  Lymphadenopathy:    She has no cervical adenopathy.  Neurological: She is alert and oriented to person, place, and time. No cranial nerve deficit. She exhibits normal muscle tone. Coordination normal.  Skin: Skin is warm and dry. No rash noted. She is not diaphoretic. No erythema. No pallor.  Psychiatric: She has a normal mood and affect. Her behavior is normal. Judgment and thought content normal.  Nursing note and vitals reviewed.      Assessment & Plan:  Essential hypertension- Stable on current medications; no change.  Asthma, mild persistent, uncomplicated-  Stable on Fluticasone MDI w/ prn Albuterol MDI use.  Diarrhea- Trial of probiotics; pt advised to contact GI office and inquire as to time of next CRS; report current problem with stool change and schedule follow-up if warranted.  Meds ordered this encounter  Medications  . Probiotic Product ([employer] PROBIOTIC MAXIMUM STRENGTH) CAPS    Sig: Take 1 capsule by mouth daily.    Dispense:  60 capsule    Refill:  5

## 2014-05-28 ENCOUNTER — Ambulatory Visit (INDEPENDENT_AMBULATORY_CARE_PROVIDER_SITE_OTHER): Payer: Medicare Other | Admitting: Podiatry

## 2014-05-28 ENCOUNTER — Encounter: Payer: Self-pay | Admitting: Podiatry

## 2014-05-28 VITALS — BP 137/71 | HR 65 | Resp 15

## 2014-05-28 DIAGNOSIS — M722 Plantar fascial fibromatosis: Secondary | ICD-10-CM | POA: Diagnosis not present

## 2014-05-28 DIAGNOSIS — L600 Ingrowing nail: Secondary | ICD-10-CM

## 2014-05-28 MED ORDER — TRIAMCINOLONE ACETONIDE 10 MG/ML IJ SUSP
10.0000 mg | Freq: Once | INTRAMUSCULAR | Status: AC
  Administered 2014-05-28: 10 mg

## 2014-05-28 NOTE — Progress Notes (Signed)
   Subjective:    Patient ID: Kathryn Gomez, female    DOB: 1940-12-13, 74 y.o.   MRN: [id number]  HPI Pt presents with bilateral nails discolored and thickened. She also c/o painful callus on her left great toe.   Review of Systems  Respiratory: Positive for cough and wheezing.   Gastrointestinal: Positive for diarrhea.  Genitourinary: Positive for urgency.  All other systems reviewed and are negative.      Objective:   Physical Exam        Assessment & Plan:

## 2014-05-28 NOTE — Patient Instructions (Signed)

## 2014-05-29 NOTE — Addendum Note (Signed)
Addended by: Lottie RaterPREVETTE, Jenafer Winterton E on: 05/29/2014 11:06 AM   Modules accepted: Kipp BroodSmartSet

## 2014-05-29 NOTE — Progress Notes (Signed)
Subjective:     Patient ID: Kathryn Gomez, female   DOB: Apr 28, 1940, 74 y.o.   MRN: [id number]  HPI patient presents my left big toenail can catch and I have damage second and third nails that I can no longer cut and they become sore in shoe gear. I had the big toenail removed and that's done okay but the end of it can become irritative in certain shoes   Review of Systems  All other systems reviewed and are negative.      Objective:   Physical Exam  Constitutional: Kathryn Gomez is oriented to person, place, and time.  Cardiovascular: Intact distal pulses.   Musculoskeletal: Normal range of motion.  Neurological: Kathryn Gomez is oriented to person, place, and time.  Skin: Skin is warm.  Nursing note and vitals reviewed.  neurovascular status found to be intact with muscle strength adequate and range of motion subtalar midtarsal joint within normal limits. Patient's noted to have a changed left big toenail but no indications of infection with distal irritation which would do well with padding and is found to have very traumatized thick second and third nails left that do not grow normally and are irritating her skin and creating discomfort     Assessment:     Left hallux nail which is doing okay and requires padding and damaged second and third nails left of a long-term permanent nature    Plan:     Reviewed conditions and discussed treatment options. Due to long-standing nature I recommended removal of the second and third nails left and explained procedure and risk. Patient wants surgery and today I infiltrated 120 mg Xylocaine Marcaine mixture removed the second and third nails exposed matrix and applied phenol for applications 30 seconds followed by alcohol lavage and sterile dressing. Gave instructions on soaks and reappoint

## 2014-06-01 ENCOUNTER — Telehealth: Payer: Self-pay | Admitting: *Deleted

## 2014-06-01 NOTE — Telephone Encounter (Addendum)
Pt states she has a question about her 3rd toenail, LOV 05/29/2014.  I left a message informing pt our office would call again.  I asked pt if she had begun her soaks and she stated she is doing Dial soaks and wondered when she should take the blue dressing off that was put on after the procedure.  I told her to take the blue dressing off and perform the soaks and cover with a antibiotic ointment and bandaid, that should help with the discomfort.  Pt states she is taking Ibuprofen for the discomfort, I told her she should get relief once she began the soaks without the blue dressing.

## 2014-08-13 ENCOUNTER — Encounter (HOSPITAL_COMMUNITY): Payer: Self-pay | Admitting: *Deleted

## 2014-08-13 ENCOUNTER — Ambulatory Visit: Payer: Medicare Other | Admitting: Physician Assistant

## 2014-08-13 ENCOUNTER — Emergency Department (HOSPITAL_COMMUNITY): Payer: No Typology Code available for payment source

## 2014-08-13 ENCOUNTER — Emergency Department (HOSPITAL_COMMUNITY)
Admission: EM | Admit: 2014-08-13 | Discharge: 2014-08-13 | Disposition: A | Discharge status: Home / Self Care | Payer: No Typology Code available for payment source | Attending: Emergency Medicine | Admitting: Emergency Medicine

## 2014-08-13 DIAGNOSIS — Y9389 Activity, other specified: Secondary | ICD-10-CM | POA: Insufficient documentation

## 2014-08-13 DIAGNOSIS — Z8701 Personal history of pneumonia (recurrent): Secondary | ICD-10-CM | POA: Diagnosis not present

## 2014-08-13 DIAGNOSIS — M1990 Unspecified osteoarthritis, unspecified site: Secondary | ICD-10-CM | POA: Diagnosis not present

## 2014-08-13 DIAGNOSIS — I10 Essential (primary) hypertension: Secondary | ICD-10-CM | POA: Insufficient documentation

## 2014-08-13 DIAGNOSIS — S299XXA Unspecified injury of thorax, initial encounter: Secondary | ICD-10-CM | POA: Diagnosis not present

## 2014-08-13 DIAGNOSIS — Z7982 Long term (current) use of aspirin: Secondary | ICD-10-CM | POA: Diagnosis not present

## 2014-08-13 DIAGNOSIS — Y92410 Unspecified street and highway as the place of occurrence of the external cause: Secondary | ICD-10-CM | POA: Diagnosis not present

## 2014-08-13 DIAGNOSIS — E780 Pure hypercholesterolemia: Secondary | ICD-10-CM | POA: Diagnosis not present

## 2014-08-13 DIAGNOSIS — Z79899 Other long term (current) drug therapy: Secondary | ICD-10-CM | POA: Diagnosis not present

## 2014-08-13 DIAGNOSIS — S20212A Contusion of left front wall of thorax, initial encounter: Secondary | ICD-10-CM

## 2014-08-13 DIAGNOSIS — R0789 Other chest pain: Secondary | ICD-10-CM | POA: Diagnosis not present

## 2014-08-13 DIAGNOSIS — Y999 Unspecified external cause status: Secondary | ICD-10-CM | POA: Insufficient documentation

## 2014-08-13 DIAGNOSIS — J45909 Unspecified asthma, uncomplicated: Secondary | ICD-10-CM | POA: Diagnosis not present

## 2014-08-13 DIAGNOSIS — R079 Chest pain, unspecified: Secondary | ICD-10-CM | POA: Diagnosis not present
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
## 2014-08-13 NOTE — ED Notes (Signed)
Pt transported to Xray with technician.

## 2014-08-13 NOTE — ED Notes (Signed)
Bed: WA07 Expected date:  Expected time:  Means of arrival:  Comments: 

## 2014-08-13 NOTE — Discharge Instructions (Signed)
Your CT scan showed atherosclerosis or hardening of the arteries around your heart.  Please follow up with your family doctor for more evaluation.     Chest Contusion A chest contusion is a deep bruise on your chest area. Contusions are the result of an injury that caused bleeding under the skin. A chest contusion may involve bruising of the skin, muscles, or ribs. The contusion may turn blue, purple, or yellow. Minor injuries will give you a painless contusion, but more severe contusions may stay painful and swollen for a few weeks. CAUSES  A contusion is usually caused by a blow, trauma, or direct force to an area of the body. SYMPTOMS   Swelling and redness of the injured area.  Discoloration of the injured area.  Tenderness and soreness of the injured area.  Pain. DIAGNOSIS  The diagnosis can be made by taking a history and performing a physical exam. An X-ray, CT scan, or MRI may be needed to determine if there were any associated injuries, such as broken bones (fractures) or internal injuries. TREATMENT  Often, the best treatment for a chest contusion is resting, icing, and applying cold compresses to the injured area. Deep breathing exercises may be recommended to reduce the risk of pneumonia. Over-the-counter medicines may also be recommended for pain control. HOME CARE INSTRUCTIONS   Put ice on the injured area.  Put ice in a plastic bag.  Place a towel between your skin and the bag.  Leave the ice on for 15-20 minutes, 03-04 times a day.  Only take over-the-counter or prescription medicines as directed by your caregiver. Your caregiver may recommend avoiding anti-inflammatory medicines (aspirin, ibuprofen, and naproxen) for 48 hours because these medicines may increase bruising.  Rest the injured area.  Perform deep-breathing exercises as directed by your caregiver.  Stop smoking if you smoke.  Do not lift objects over 5 pounds (2.3 kg) for 3 days or longer if  recommended by your caregiver. SEEK IMMEDIATE MEDICAL CARE IF:   You have increased bruising or swelling.  You have pain that is getting worse.  You have difficulty breathing.  You have dizziness, weakness, or fainting.  You have blood in your urine or stool.  You cough up or vomit blood.  Your swelling or pain is not relieved with medicines. MAKE SURE YOU:   Understand these instructions.  Will watch your condition.  Will get help right away if you are not doing well or get worse. Document Released: 10/11/2000 Document Revised: 10/11/2011 Document Reviewed: 07/10/2011 [facility] Patient Information 2015 [place] [place]. This information is not intended to replace advice given to you by your health care provider. Make sure you discuss any questions you have with your health care provider.
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
## 2014-08-13 NOTE — ED Notes (Deleted)
Pt and daughter report, last known normal last night. Daughter reports pt was disoriented, spitting up, and having decreased left vision. Pt alert and oriented x4 at present. Hx of DM and HTN. Pt moved lawn yesterday, today having difficulty walking, needed assistance.

## 2014-08-20 ENCOUNTER — Ambulatory Visit (INDEPENDENT_AMBULATORY_CARE_PROVIDER_SITE_OTHER): Payer: Medicare Other | Admitting: Physician Assistant

## 2014-08-20 VITALS — BP 130/88 | HR 87 | Temp 98.4°F | Resp 17 | Ht 64.5 in | Wt 141.0 lb

## 2014-08-20 DIAGNOSIS — S161XXA Strain of muscle, fascia and tendon at neck level, initial encounter: Secondary | ICD-10-CM | POA: Diagnosis not present

## 2014-08-20 DIAGNOSIS — R7989 Other specified abnormal findings of blood chemistry: Secondary | ICD-10-CM

## 2014-08-20 DIAGNOSIS — R748 Abnormal levels of other serum enzymes: Secondary | ICD-10-CM | POA: Diagnosis not present

## 2014-08-20 NOTE — Patient Instructions (Addendum)
Tylenol 1000 mg three times a day for pain. Apply heating pad to neck when at home. Gentle massage and gentle stretching can help. I will call you with results from your lab tests. If not getting better in 1-2 weeks, return. Return in the next 2 months for appointment with Chelle.  Cervical Sprain A cervical sprain is an injury in the neck in which the strong, fibrous tissues (ligaments) that connect your neck bones stretch or tear. Cervical sprains can range from mild to severe. Severe cervical sprains can cause the neck vertebrae to be unstable. This can lead to damage of the spinal cord and can result in serious nervous system problems. The amount of time it takes for a cervical sprain to get better depends on the cause and extent of the injury. Most cervical sprains heal in 1 to 3 weeks. CAUSES  Severe cervical sprains may be caused by:   Contact sport injuries (such as from football, rugby, wrestling, hockey, auto racing, gymnastics, diving, martial arts, or boxing).   Motor vehicle collisions.   Whiplash injuries. This is an injury from a sudden forward and backward whipping movement of the head and neck.  Falls.  Mild cervical sprains may be caused by:   Being in an awkward position, such as while cradling a telephone between your ear and shoulder.   Sitting in a chair that does not offer proper support.   Working at a poorly Marketing executive station.   Looking up or down for long periods of time.  SYMPTOMS   Pain, soreness, stiffness, or a burning sensation in the front, back, or sides of the neck. This discomfort may develop immediately after the injury or slowly, 24 hours or more after the injury.   Pain or tenderness directly in the middle of the back of the neck.   Shoulder or upper back pain.   Limited ability to move the neck.   Headache.   Dizziness.   Weakness, numbness, or tingling in the hands or arms.   Muscle spasms.   Difficulty  swallowing or chewing.   Tenderness and swelling of the neck.  DIAGNOSIS  Most of the time your health care provider can diagnose a cervical sprain by taking your history and doing a physical exam. Your health care provider will ask about previous neck injuries and any known neck problems, such as arthritis in the neck. X-rays may be taken to find out if there are any other problems, such as with the bones of the neck. Other tests, such as a CT scan or MRI, may also be needed.  TREATMENT  Treatment depends on the severity of the cervical sprain. Mild sprains can be treated with rest, keeping the neck in place (immobilization), and pain medicines. Severe cervical sprains are immediately immobilized. Further treatment is done to help with pain, muscle spasms, and other symptoms and may include:  Medicines, such as pain relievers, numbing medicines, or muscle relaxants.   Physical therapy. This may involve stretching exercises, strengthening exercises, and posture training. Exercises and improved posture can help stabilize the neck, strengthen muscles, and help stop symptoms from returning.  HOME CARE INSTRUCTIONS   Put ice on the injured area.   Put ice in a plastic bag.   Place a towel between your skin and the bag.   Leave the ice on for 15-20 minutes, 3-4 times a day.   If your injury was severe, you may have been given a cervical collar to wear. A cervical collar  is a two-piece collar designed to keep your neck from moving while it heals.  Do not remove the collar unless instructed by your health care provider.  If you have long hair, keep it outside of the collar.  Ask your health care provider before making any adjustments to your collar. Minor adjustments may be required over time to improve comfort and reduce pressure on your chin or on the back of your head.  Ifyou are allowed to remove the collar for cleaning or bathing, follow your health care provider's instructions on  how to do so safely.  Keep your collar clean by wiping it with mild soap and water and drying it completely. If the collar you have been given includes removable pads, remove them every 1-2 days and hand wash them with soap and water. Allow them to air dry. They should be completely dry before you wear them in the collar.  If you are allowed to remove the collar for cleaning and bathing, wash and dry the skin of your neck. Check your skin for irritation or sores. If you see any, tell your health care provider.  Do not drive while wearing the collar.   Only take over-the-counter or prescription medicines for pain, discomfort, or fever as directed by your health care provider.   Keep all follow-up appointments as directed by your health care provider.   Keep all physical therapy appointments as directed by your health care provider.   Make any needed adjustments to your workstation to promote good posture.   Avoid positions and activities that make your symptoms worse.   Warm up and stretch before being active to help prevent problems.  SEEK MEDICAL CARE IF:   Your pain is not controlled with medicine.   You are unable to decrease your pain medicine over time as planned.   Your activity level is not improving as expected.  SEEK IMMEDIATE MEDICAL CARE IF:   You develop any bleeding.  You develop stomach upset.  You have signs of an allergic reaction to your medicine.   Your symptoms get worse.   You develop new, unexplained symptoms.   You have numbness, tingling, weakness, or paralysis in any part of your body.  MAKE SURE YOU:   Understand these instructions.  Will watch your condition.  Will get help right away if you are not doing well or get worse. Document Released: 11/13/2006 Document Revised: 01/21/2013 Document Reviewed: 07/24/2012 [facility] Patient Information 2015 [place], [place]. This information is not intended to replace advice given to you by  your health care provider. Make sure you discuss any questions you have with your health care provider.
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
## 2014-08-21 LAB — COMPLETE METABOLIC PANEL WITH GFR
ALT: 16 U/L (ref 0–35)
AST: 24 U/L (ref 0–37)
Albumin: 4 g/dL (ref 3.5–5.2)
Alkaline Phosphatase: 71 U/L (ref 39–117)
BUN: 20 mg/dL (ref 6–23)
CO2: 23 mEq/L (ref 19–32)
Calcium: 9.2 mg/dL (ref 8.4–10.5)
Chloride: 106 mEq/L (ref 96–112)
Creat: 0.99 mg/dL (ref 0.50–1.10)
GFR, EST AFRICAN AMERICAN: 65 mL/min
GFR, EST NON AFRICAN AMERICAN: 56 mL/min — AB
Glucose, Bld: 101 mg/dL — ABNORMAL HIGH (ref 70–99)
Potassium: 4.9 mEq/L (ref 3.5–5.3)
SODIUM: 142 meq/L (ref 135–145)
Total Bilirubin: 0.3 mg/dL (ref 0.2–1.2)
Total Protein: 6.9 g/dL (ref 6.0–8.3)

## 2014-09-24 ENCOUNTER — Encounter: Payer: Self-pay | Admitting: Physician Assistant

## 2014-09-24 ENCOUNTER — Ambulatory Visit (INDEPENDENT_AMBULATORY_CARE_PROVIDER_SITE_OTHER): Payer: Medicare Other | Admitting: Physician Assistant

## 2014-09-24 VITALS — BP 134/66 | HR 75 | Temp 98.2°F | Resp 16 | Ht 65.0 in | Wt 139.6 lb

## 2014-09-24 DIAGNOSIS — R197 Diarrhea, unspecified: Secondary | ICD-10-CM | POA: Diagnosis not present

## 2014-09-24 DIAGNOSIS — R739 Hyperglycemia, unspecified: Secondary | ICD-10-CM | POA: Diagnosis not present

## 2014-09-24 DIAGNOSIS — I10 Essential (primary) hypertension: Secondary | ICD-10-CM | POA: Diagnosis not present

## 2014-09-24 DIAGNOSIS — E7800 Pure hypercholesterolemia, unspecified: Secondary | ICD-10-CM

## 2014-09-24 DIAGNOSIS — J4530 Mild persistent asthma, uncomplicated: Secondary | ICD-10-CM

## 2014-09-24 DIAGNOSIS — Z23 Encounter for immunization: Secondary | ICD-10-CM | POA: Diagnosis not present

## 2014-09-24 LAB — POCT GLYCOSYLATED HEMOGLOBIN (HGB A1C): Hemoglobin A1C: 5.7

## 2014-09-24 LAB — COMPREHENSIVE METABOLIC PANEL
ALK PHOS: 72 U/L (ref 33–130)
ALT: 11 U/L (ref 6–29)
AST: 15 U/L (ref 10–35)
Albumin: 3.9 g/dL (ref 3.6–5.1)
BUN: 26 mg/dL — AB (ref 7–25)
CALCIUM: 9 mg/dL (ref 8.6–10.4)
CHLORIDE: 108 mmol/L (ref 98–110)
CO2: 22 mmol/L (ref 20–31)
Creat: 1.1 mg/dL — ABNORMAL HIGH (ref 0.60–0.93)
GLUCOSE: 93 mg/dL (ref 65–99)
POTASSIUM: 4.6 mmol/L (ref 3.5–5.3)
Sodium: 140 mmol/L (ref 135–146)
Total Bilirubin: 0.4 mg/dL (ref 0.2–1.2)
Total Protein: 6.6 g/dL (ref 6.1–8.1)

## 2014-09-24 LAB — CBC WITH DIFFERENTIAL/PLATELET
BASOS ABS: 0 10*3/uL (ref 0.0–0.1)
Basophils Relative: 0 % (ref 0–1)
Eosinophils Absolute: 0.4 10*3/uL (ref 0.0–0.7)
Eosinophils Relative: 8 % — ABNORMAL HIGH (ref 0–5)
HEMATOCRIT: 33.7 % — AB (ref 36.0–46.0)
Hemoglobin: 10.8 g/dL — ABNORMAL LOW (ref 12.0–15.0)
LYMPHS ABS: 1.6 10*3/uL (ref 0.7–4.0)
LYMPHS PCT: 30 % (ref 12–46)
MCH: 29.9 pg (ref 26.0–34.0)
MCHC: 32 g/dL (ref 30.0–36.0)
MCV: 93.4 fL (ref 78.0–100.0)
MONO ABS: 0.4 10*3/uL (ref 0.1–1.0)
MPV: 10.8 fL (ref 8.6–12.4)
Monocytes Relative: 8 % (ref 3–12)
Neutro Abs: 2.8 10*3/uL (ref 1.7–7.7)
Neutrophils Relative %: 54 % (ref 43–77)
Platelets: 265 10*3/uL (ref 150–400)
RBC: 3.61 MIL/uL — ABNORMAL LOW (ref 3.87–5.11)
RDW: 13.8 % (ref 11.5–15.5)
WBC: 5.2 10*3/uL (ref 4.0–10.5)

## 2014-09-24 LAB — LIPID PANEL
CHOL/HDL RATIO: 2.9 ratio (ref <=5.0)
Cholesterol: 137 mg/dL (ref 125–200)
HDL: 48 mg/dL (ref >=46)
LDL Cholesterol: 70 mg/dL (ref <130)
TRIGLYCERIDES: 94 mg/dL (ref <150)
VLDL: 19 mg/dL (ref <30)

## 2014-09-24 LAB — GLUCOSE, POCT (MANUAL RESULT ENTRY): POC GLUCOSE: 108 mg/dL — AB (ref 70–99)

## 2014-09-24 LAB — TSH: TSH: 2.429 u[IU]/mL (ref 0.350–4.500)

## 2014-09-24 MED ORDER — AMLODIPINE BESYLATE 5 MG PO TABS: ORAL_TABLET | ORAL | Status: DC

## 2014-09-24 MED ORDER — ALBUTEROL SULFATE HFA 108 (90 BASE) MCG/ACT IN AERS: 2.0000 | INHALATION_SPRAY | Freq: Four times a day (QID) | RESPIRATORY_TRACT | Status: DC | PRN

## 2014-09-24 MED ORDER — LISINOPRIL-HYDROCHLOROTHIAZIDE 20-12.5 MG PO TABS: 2.0000 | ORAL_TABLET | Freq: Every morning | ORAL | Status: DC

## 2014-09-24 MED ORDER — SIMVASTATIN 20 MG PO TABS: ORAL_TABLET | ORAL | Status: DC
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
## 2014-09-24 NOTE — Patient Instructions (Addendum)
Come get your flu vaccine as soon as you are ready. Let me know when you are ready to back to see Dr. Elnoria Howard about the diarrhea. When you come back, don't have anything to eat or drink (you may drink water with your medications) and we will recheck your labs, especially the sugar.

## 2014-12-29 ENCOUNTER — Encounter: Payer: Self-pay | Admitting: Physician Assistant

## 2014-12-29 ENCOUNTER — Ambulatory Visit (INDEPENDENT_AMBULATORY_CARE_PROVIDER_SITE_OTHER): Payer: Medicare Other | Admitting: Physician Assistant

## 2014-12-29 VITALS — BP 142/80 | HR 76 | Temp 98.4°F | Resp 16 | Ht 66.0 in | Wt 138.0 lb

## 2014-12-29 DIAGNOSIS — I10 Essential (primary) hypertension: Secondary | ICD-10-CM

## 2014-12-29 DIAGNOSIS — R739 Hyperglycemia, unspecified: Secondary | ICD-10-CM | POA: Diagnosis not present

## 2014-12-29 DIAGNOSIS — E7800 Pure hypercholesterolemia, unspecified: Secondary | ICD-10-CM

## 2014-12-29 DIAGNOSIS — D649 Anemia, unspecified: Secondary | ICD-10-CM | POA: Diagnosis not present

## 2014-12-29 DIAGNOSIS — R197 Diarrhea, unspecified: Secondary | ICD-10-CM | POA: Diagnosis not present

## 2014-12-29 DIAGNOSIS — Z23 Encounter for immunization: Secondary | ICD-10-CM | POA: Diagnosis not present

## 2014-12-29 LAB — CBC WITH DIFFERENTIAL/PLATELET
Basophils Absolute: 0 10*3/uL (ref 0.0–0.1)
Basophils Relative: 0 % (ref 0–1)
EOS ABS: 0.3 10*3/uL (ref 0.0–0.7)
Eosinophils Relative: 5 % (ref 0–5)
HCT: 35.8 % — ABNORMAL LOW (ref 36.0–46.0)
Hemoglobin: 12 g/dL (ref 12.0–15.0)
Lymphocytes Relative: 26 % (ref 12–46)
Lymphs Abs: 1.6 10*3/uL (ref 0.7–4.0)
MCH: 31.4 pg (ref 26.0–34.0)
MCHC: 33.5 g/dL (ref 30.0–36.0)
MCV: 93.7 fL (ref 78.0–100.0)
MONO ABS: 0.4 10*3/uL (ref 0.1–1.0)
MPV: 10.4 fL (ref 8.6–12.4)
Monocytes Relative: 7 % (ref 3–12)
Neutro Abs: 3.7 10*3/uL (ref 1.7–7.7)
Neutrophils Relative %: 62 % (ref 43–77)
Platelets: 298 10*3/uL (ref 150–400)
RBC: 3.82 MIL/uL — ABNORMAL LOW (ref 3.87–5.11)
RDW: 13.6 % (ref 11.5–15.5)
WBC: 6 10*3/uL (ref 4.0–10.5)

## 2014-12-29 LAB — COMPREHENSIVE METABOLIC PANEL
ALBUMIN: 4 g/dL (ref 3.6–5.1)
ALT: 10 U/L (ref 6–29)
AST: 18 U/L (ref 10–35)
Alkaline Phosphatase: 77 U/L (ref 33–130)
BILIRUBIN TOTAL: 0.4 mg/dL (ref 0.2–1.2)
BUN: 22 mg/dL (ref 7–25)
CO2: 23 mmol/L (ref 20–31)
CREATININE: 0.93 mg/dL (ref 0.60–0.93)
Calcium: 9.2 mg/dL (ref 8.6–10.4)
Chloride: 106 mmol/L (ref 98–110)
Glucose, Bld: 95 mg/dL (ref 65–99)
Potassium: 4.5 mmol/L (ref 3.5–5.3)
SODIUM: 137 mmol/L (ref 135–146)
Total Protein: 7 g/dL (ref 6.1–8.1)

## 2014-12-29 LAB — HEMOGLOBIN A1C: Hgb A1c MFr Bld: 5.9 % (ref 4.0–6.0)

## 2014-12-29 LAB — GLUCOSE, POCT (MANUAL RESULT ENTRY): POC Glucose: 108 mg/dl — AB (ref 70–99)
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
## 2014-12-29 NOTE — Patient Instructions (Addendum)

## 2014-12-29 NOTE — Progress Notes (Signed)
Patient ID: Kathryn Gomez, female    DOB: [birth year], 74 y.o.   MRN: [id number]  PCP: Markez Dowland, PA-C  Subjective:   Chief Complaint  Patient presents with  . labs  . Flu Vaccine    HPI Patient presents today for 1-month follow-up of HTN and HLD. Last seen by me on 09/24/2014.   HTN--Currently on lisinopril-HCTZ 20-12.5 mg 2 tabs every morning and amlodipine 5 mg daily without any complaints. Denies chest pain, HA, palpitations, lightheadedness, dizziness, or syncope.   HLD--Currently on simvastatin 20 mg daily. Does not exercise because she "works all the time". Employed at [employer], works 8-9 hour shifts 4 days/week. No dietary changes in the last three months, weight has been stable.   Hyperglycemia--POC glucose was 108 and A1C was 5.7 three months ago. Never diagnosed with DM in the past. Last seen by ophthalmology in January and was told she needed cataract surgery, but has not had any further follow-up. Complains of occasionally seeing "black spots" for a couple seconds after waking up from a nap, but denies blurry vision, photophobia, eye pain, or vision loss.   Asthma--Still well-controlled on her albuterol inhaler. Uses approx. once a week at night with wheezing spells and the cold.   Diarrhea--Has remained stable since last visit. Cannot identify any food triggers or stressors. Still taking the same OTC probiotic. Wanted to f/u with Dr. Elnoria Howard at her last visit, but did not have a car at that time. Has since purchased a car and would now like a referral to see him. Denies abdominal pain, N/V, constipation, bloody stools, weight loss, or changes in appetite.   Patient is due for her annual flu vaccination, would like to get this today.     Review of Systems Constitutional: Negative for fever, chills, activity change, appetite change, fatigue and unexpected weight change.  Eyes: Positive for visual disturbance (will occasionally see "black spots" when she wakes up  from a nap). Negative for photophobia and pain.  Respiratory: Negative for cough and shortness of breath.  Cardiovascular: Negative for chest pain and palpitations.  Gastrointestinal: Positive for diarrhea. Negative for nausea, vomiting, abdominal pain, constipation and blood in stool.  Neurological: Negative for dizziness, weakness, light-headedness and headaches.  Psychiatric/Behavioral: Negative for sleep disturbance.      Patient Active Problem List   Diagnosis Date Noted  . Diarrhea 09/24/2014  . Expected blood loss anemia 09/04/2012  . Overweight (BMI 25.0-29.9) 09/04/2012  . S/P left THA, AA 09/03/2012  . High cholesterol 03/26/2011  . Hypertension 03/26/2011  . Asthma 03/26/2011     Prior to Admission medications   Medication Sig Start Date End Date Taking? Authorizing Provider  albuterol (PROVENTIL HFA;VENTOLIN HFA) 108 (90 BASE) MCG/ACT inhaler Inhale 2 puffs into the lungs every 6 (six) hours as needed for wheezing or shortness of breath. 09/24/14  Yes Sarahi Borland, PA-C  amLODipine (NORVASC) 5 MG tablet TAKE 1 TABLET BY MOUTH EVERY DAY . 09/24/14  Yes Flint Hakeem, PA-C  aspirin 81 MG tablet Take 81 mg by mouth daily.   Yes Historical Provider, MD  lisinopril-hydrochlorothiazide (PRINZIDE,ZESTORETIC) 20-12.5 MG per tablet Take 2 tablets by mouth every morning. 09/24/14  Yes Krina Mraz, PA-C  loratadine (CLARITIN) 10 MG tablet Take 10 mg by mouth daily.   Yes Historical Provider, MD  OVER THE COUNTER MEDICATION OTC PreserVision eye vitamin and mineral supplement taking one in the am and one at night   Yes Historical Provider, MD  Probiotic Product (CVS PROBIOTIC  MAXIMUM STRENGTH) CAPS Take 1 capsule by mouth daily. 04/02/14  Yes Maurice MarchBarbara B McPherson, MD  simvastatin (ZOCOR) 20 MG tablet TAKE 1 TABLET BY MOUTH DAILY 09/24/14  Yes Aldean Pipe, PA-C  fluticasone (FLOVENT HFA) 110 MCG/ACT inhaler Inhale 2 puffs into the lungs 2 (two) times daily. Patient not taking:  Reported on 12/29/2014 04/17/13   Maurice MarchBarbara B McPherson, MD  ipratropium (ATROVENT) 0.03 % nasal spray PLACE 2 SPRAYS INTO THE NOSE EVERY 12 (TWELVE) HOURS. Patient not taking: Reported on 12/29/2014 02/24/14   Maurice MarchBarbara B McPherson, MD     No Known Allergies     Objective:  Physical Exam  Constitutional: She is oriented to person, place, and time. Vital signs are normal. She appears well-developed and well-nourished. She is active and cooperative. No distress.  BP 142/80 mmHg  Pulse 76  Temp(Src) 98.4 F (36.9 C) (Oral)  Resp 16  Ht 5\' 6"  (1.676 m)  Wt 138 lb (62.596 kg)  BMI 22.28 kg/m2  SpO2 99%  HENT:  Head: Normocephalic and atraumatic.  Right Ear: Hearing normal.  Left Ear: Hearing normal.  Eyes: Conjunctivae are normal. No scleral icterus.  Neck: Normal range of motion. Neck supple. No thyromegaly present.  Cardiovascular: Normal rate, regular rhythm and normal heart sounds.   Pulses:      Radial pulses are 2+ on the right side, and 2+ on the left side.  Pulmonary/Chest: Effort normal and breath sounds normal.  Lymphadenopathy:       Head (right side): No tonsillar, no preauricular, no posterior auricular and no occipital adenopathy present.       Head (left side): No tonsillar, no preauricular, no posterior auricular and no occipital adenopathy present.    She has no cervical adenopathy.       Right: No supraclavicular adenopathy present.       Left: No supraclavicular adenopathy present.  Neurological: She is alert and oriented to person, place, and time. No sensory deficit.  Skin: Skin is warm, dry and intact. No rash noted. No cyanosis or erythema. Nails show no clubbing.  Psychiatric: She has a normal mood and affect. Her speech is normal and behavior is normal.           Assessment & Plan:   1. Essential hypertension Slightly above goal. Await labs. If glucose remains elevated, plan to increase her anti-hypertensive regimen by either increasing amlodipine to  10 mg or increase the lisinoprilHCT to 20/25 mg. - Comprehensive metabolic panel  2. Hyperglycemia If elevated, will add A1C. - POCT glucose (manual entry)  3. High cholesterol Stable.  4. Diarrhea, unspecified type Persists. Ready to go back to Dr. Elnoria HowardHung for additional evaluation. - Ambulatory referral to Gastroenterology  5. Anemia, unspecified anemia type Update. Re-evaluation with GI as above. - CBC with Differential/Platelet  6. Needs flu shot - Flu Vaccine QUAD 36+ mos IM   Fernande Brashelle S. Kimberleigh Mehan, PA-C Physician Assistant-Certified [facility]

## 2014-12-30 LAB — HEMOGLOBIN A1C
Hgb A1c MFr Bld: 5.9 % — ABNORMAL HIGH (ref <5.7)
Mean Plasma Glucose: 123 mg/dL — ABNORMAL HIGH (ref <117)

## 2015-01-04 ENCOUNTER — Other Ambulatory Visit: Payer: Self-pay | Admitting: *Deleted

## 2015-01-04 ENCOUNTER — Telehealth: Payer: Self-pay | Admitting: Physician Assistant

## 2015-01-04 MED ORDER — LISINOPRIL-HYDROCHLOROTHIAZIDE 20-25 MG PO TABS: 1.0000 | ORAL_TABLET | Freq: Every day | ORAL | Status: DC

## 2015-01-04 NOTE — Telephone Encounter (Signed)
Please call this patient.  I got her labs back, and we are ready to adjust her blood pressure medication.  Please stop the lisinopril-HCTZ 20-12.5 that she is currently taking.  I have sent a new prescription for Lisinopril-HCTZ 20-25 mg. She should take one each day.  Meds ordered this encounter  Medications  . lisinopril-hydrochlorothiazide (PRINZIDE,ZESTORETIC) 20-25 MG tablet    Sig: Take 1 tablet by mouth daily.    Dispense:  90 tablet    Refill:  3    Order Specific Question:  Supervising Provider    Answer:  DOOLITTLE, ROBERT P [[id number]]

## 2015-01-05 NOTE — Telephone Encounter (Signed)
Unable to reach Pt. Left VM to call back 

## 2015-01-06 NOTE — Telephone Encounter (Signed)
[facility]

## 2015-01-07 NOTE — Telephone Encounter (Signed)
Spoke with patient and informed her to stop the lisinopril she is currently taking and start the new prescription that is at the pharmacy.

## 2015-01-20 DIAGNOSIS — I10 Essential (primary) hypertension: Secondary | ICD-10-CM | POA: Diagnosis not present

## 2015-01-20 DIAGNOSIS — R1084 Generalized abdominal pain: Secondary | ICD-10-CM | POA: Diagnosis not present

## 2015-01-20 DIAGNOSIS — R197 Diarrhea, unspecified: Secondary | ICD-10-CM | POA: Diagnosis not present

## 2015-02-25 DIAGNOSIS — H5203 Hypermetropia, bilateral: Secondary | ICD-10-CM | POA: Diagnosis not present

## 2015-02-25 DIAGNOSIS — H2513 Age-related nuclear cataract, bilateral: Secondary | ICD-10-CM | POA: Diagnosis not present

## 2015-02-25 DIAGNOSIS — H52223 Regular astigmatism, bilateral: Secondary | ICD-10-CM | POA: Diagnosis not present

## 2015-02-25 DIAGNOSIS — H524 Presbyopia: Secondary | ICD-10-CM | POA: Diagnosis not present

## 2015-02-27 ENCOUNTER — Other Ambulatory Visit: Payer: Self-pay | Admitting: Physician Assistant

## 2015-04-04 ENCOUNTER — Ambulatory Visit (INDEPENDENT_AMBULATORY_CARE_PROVIDER_SITE_OTHER): Payer: Medicare Other | Admitting: Internal Medicine

## 2015-04-04 VITALS — BP 122/76 | HR 88 | Temp 98.4°F | Resp 16 | Ht 65.0 in | Wt 136.0 lb

## 2015-04-04 DIAGNOSIS — R059 Cough, unspecified: Secondary | ICD-10-CM

## 2015-04-04 DIAGNOSIS — R05 Cough: Secondary | ICD-10-CM | POA: Diagnosis not present

## 2015-04-04 MED ORDER — HYDROCODONE-HOMATROPINE 5-1.5 MG/5ML PO SYRP: 5.0000 mL | ORAL_SOLUTION | Freq: Four times a day (QID) | ORAL | Status: DC | PRN

## 2015-04-04 NOTE — Progress Notes (Signed)
   Subjective:    Patient ID: Kathryn Gomez, female    DOB: January 18, 1941, 75 y.o.   MRN: [id number] By signing my name below, I, Littie Deedsichard Sun, attest that this documentation has been prepared under the direction and in the presence of Ellamae Siaobert Sheilyn Boehlke, MD.  Electronically Signed: Littie Deedsichard Sun, Medical Scribe. 04/04/2015. 12:07 PM.  HPI HPI Comments: Kathryn Gomez is a 75 y.o. female who presents to the [facility] complaining of gradual onset cough that started 2 days ago. Patient reports having associated fever of 101 F, fatigue, and lower left back pain. She last took her temperature this morning at 101 F. She has had difficulty sleeping due to the cough. Patient denies urinary symptoms, sore throat, and sinus symptoms.  Review of Systems [place]    Objective:   Physical Exam  Constitutional: She is oriented to person, place, and time. She appears well-developed and well-nourished. No distress.  HENT:  Head: Normocephalic and atraumatic.  Mouth/Throat: Oropharynx is clear and moist. No oropharyngeal exudate.  Eyes: Pupils are equal, round, and reactive to light.  Neck: Neck supple.  Cardiovascular: Normal rate.   Pulmonary/Chest: Effort normal and breath sounds normal. No respiratory distress. She has no wheezes. She has no rales.  Clear to auscultation bilaterally.   Musculoskeletal: She exhibits no edema.  Neurological: She is alert and oriented to person, place, and time. No cranial nerve deficit.  Skin: Skin is warm and dry. No rash noted.  Psychiatric: She has a normal mood and affect. Her behavior is normal.  Nursing note and vitals reviewed. BP 122/76 mmHg  Pulse 88  Temp(Src) 98.4 F (36.9 C) (Oral)  Resp 16  Ht 5\' 5"  (1.651 m)  Wt 136 lb (61.689 kg)  BMI 22.63 kg/m2  SpO2 98%     Assessment & Plan:  Cough//viral syndrome==flu Meds ordered this encounter  Medications  . HYDROcodone-homatropine (HYCODAN) 5-1.5 MG/5ML syrup    Sig: Take 5 mLs by mouth  every 6 (six) hours as needed for cough.    Dispense:  120 mL    Refill:  0  Fluids and OTC antipyretics Follow-up 3-5 days if not well  I have completed the patient encounter in its entirety as documented by the scribe, with editing by me where necessary. Wally Shevchenko P. Merla Richesoolittle, M.D.

## 2015-04-21 ENCOUNTER — Other Ambulatory Visit: Payer: Self-pay

## 2015-04-21 DIAGNOSIS — E7800 Pure hypercholesterolemia, unspecified: Secondary | ICD-10-CM

## 2015-04-21 DIAGNOSIS — I10 Essential (primary) hypertension: Secondary | ICD-10-CM

## 2015-04-21 MED ORDER — AMLODIPINE BESYLATE 5 MG PO TABS: ORAL_TABLET | ORAL | Status: DC

## 2015-04-21 MED ORDER — SIMVASTATIN 20 MG PO TABS: ORAL_TABLET | ORAL | Status: DC

## 2015-04-22 ENCOUNTER — Ambulatory Visit (INDEPENDENT_AMBULATORY_CARE_PROVIDER_SITE_OTHER): Payer: Medicare Other | Admitting: Family Medicine

## 2015-04-22 VITALS — BP 138/70 | HR 70 | Temp 98.4°F | Resp 16 | Ht 65.7 in | Wt 139.6 lb

## 2015-04-22 DIAGNOSIS — R05 Cough: Secondary | ICD-10-CM

## 2015-04-22 DIAGNOSIS — R059 Cough, unspecified: Secondary | ICD-10-CM

## 2015-04-22 MED ORDER — BENZONATATE 200 MG PO CAPS: 200.0000 mg | ORAL_CAPSULE | Freq: Two times a day (BID) | ORAL | Status: DC | PRN

## 2015-04-22 NOTE — Progress Notes (Signed)
° °  Subjective:  By signing my name below, I, Kathryn Gomez, attest that this documentation has been prepared under the direction and in the presence of Elvina SidleKurt Lauenstein, MD. Electronically Signed: Linus GalasMaharshi Gomez, ED Scribe. 04/22/2015. 2:16 PM.   Patient ID: Kathryn Gomez, female    DOB: Dec 06, 1940, 75 y.o.   MRN: [id number] Chief Complaint  Patient presents with   RECHECK COUGH    STILL BAD    HPI HPI Comments: Kathryn Mcalpinelma W Lutter is a 75 y.o. female who presents to the Emergency Department for a reevaluation of a cough. Pt reports she coughs through the night. Pt denies fever, ear pain, or any other symptoms at this time. Pt reports she felt very drowsy when she was taking Tussionex cough syrup.  Pt works at [employer]  Past Medical History  Diagnosis Date   Asthma    Allergy    Cataract     bilateral   Hypertension    Hypercholesteremia    Pneumonia     hx of   Arthritis    Current Outpatient Prescriptions on File Prior to Visit  Medication Sig Dispense Refill   albuterol (PROVENTIL HFA;VENTOLIN HFA) 108 (90 BASE) MCG/ACT inhaler Inhale 2 puffs into the lungs every 6 (six) hours as needed for wheezing or shortness of breath. 1 Inhaler 3   amLODipine (NORVASC) 5 MG tablet TAKE 1 TABLET BY MOUTH EVERY DAY . 90 tablet 0   aspirin 81 MG tablet Take 81 mg by mouth daily.     fluticasone (FLOVENT HFA) 110 MCG/ACT inhaler Inhale 2 puffs into the lungs 2 (two) times daily. 1 Inhaler 12   ipratropium (ATROVENT) 0.03 % nasal spray PLACE 2 SPRAYS INTO THE NOSE EVERY 12 (TWELVE) HOURS. 90 mL 0   lisinopril-hydrochlorothiazide (PRINZIDE,ZESTORETIC) 20-25 MG tablet Take 1 tablet by mouth daily. 90 tablet 3   loratadine (CLARITIN) 10 MG tablet Take 10 mg by mouth daily.     OVER THE COUNTER MEDICATION OTC PreserVision eye vitamin and mineral supplement taking one in the am and one at night     Probiotic Product (CVS PROBIOTIC MAXIMUM STRENGTH) CAPS Take 1 capsule by mouth daily. 60  capsule 5   simvastatin (ZOCOR) 20 MG tablet TAKE 1 TABLET BY MOUTH DAILY 90 tablet 0   No current facility-administered medications on file prior to visit.   No Known Allergies   Review of Systems  Constitutional: Negative for fever.  HENT: Negative for ear pain.   Respiratory: Positive for cough.       Objective:   Physical Exam  Constitutional: She is oriented to person, place, and time. She appears well-developed and well-nourished.  HENT:  Head: Normocephalic and atraumatic.  Cardiovascular: Normal rate.   Pulmonary/Chest: Effort normal.  Few basilar rales  Abdominal: She exhibits no distension.  Neurological: She is alert and oriented to person, place, and time.  Skin: Skin is warm and dry.  Psychiatric: She has a normal mood and affect.  Nursing note and vitals reviewed.     Assessment & Plan:   1. Cough     This chart was scribed in my presence and reviewed by me personally.    ICD-9-CM ICD-10-CM   1. Cough 786.2 R05 benzonatate (TESSALON) 200 MG capsule     Signed, Elvina SidleKurt Lauenstein, MD

## 2015-07-01 ENCOUNTER — Ambulatory Visit (INDEPENDENT_AMBULATORY_CARE_PROVIDER_SITE_OTHER): Payer: Medicare Other | Admitting: Family Medicine

## 2015-07-01 ENCOUNTER — Encounter: Payer: Self-pay | Admitting: Family Medicine

## 2015-07-01 VITALS — BP 140/81 | HR 74 | Temp 98.3°F | Resp 16 | Ht 66.0 in | Wt 136.8 lb

## 2015-07-01 DIAGNOSIS — J4530 Mild persistent asthma, uncomplicated: Secondary | ICD-10-CM

## 2015-07-01 DIAGNOSIS — E7800 Pure hypercholesterolemia, unspecified: Secondary | ICD-10-CM

## 2015-07-01 DIAGNOSIS — I10 Essential (primary) hypertension: Secondary | ICD-10-CM | POA: Diagnosis not present

## 2015-07-01 DIAGNOSIS — Z5181 Encounter for therapeutic drug level monitoring: Secondary | ICD-10-CM | POA: Diagnosis not present

## 2015-07-01 LAB — COMPREHENSIVE METABOLIC PANEL
ALT: 10 U/L (ref 6–29)
AST: 16 U/L (ref 10–35)
Albumin: 4 g/dL (ref 3.6–5.1)
Alkaline Phosphatase: 74 U/L (ref 33–130)
BUN: 24 mg/dL (ref 7–25)
CHLORIDE: 106 mmol/L (ref 98–110)
CO2: 23 mmol/L (ref 20–31)
CREATININE: 1.05 mg/dL — AB (ref 0.60–0.93)
Calcium: 9 mg/dL (ref 8.6–10.4)
Glucose, Bld: 91 mg/dL (ref 65–99)
POTASSIUM: 4.3 mmol/L (ref 3.5–5.3)
SODIUM: 140 mmol/L (ref 135–146)
TOTAL PROTEIN: 6.7 g/dL (ref 6.1–8.1)
Total Bilirubin: 0.4 mg/dL (ref 0.2–1.2)

## 2015-07-01 LAB — LIPID PANEL
CHOL/HDL RATIO: 2.5 ratio (ref <=5.0)
CHOLESTEROL: 135 mg/dL (ref 125–200)
HDL: 54 mg/dL (ref >=46)
LDL CALC: 65 mg/dL (ref <130)
Triglycerides: 78 mg/dL (ref <150)
VLDL: 16 mg/dL (ref <30)

## 2015-07-01 MED ORDER — AMLODIPINE BESYLATE 5 MG PO TABS: ORAL_TABLET | ORAL | Status: DC

## 2015-07-01 MED ORDER — ALBUTEROL SULFATE HFA 108 (90 BASE) MCG/ACT IN AERS: 2.0000 | INHALATION_SPRAY | Freq: Four times a day (QID) | RESPIRATORY_TRACT | Status: DC | PRN

## 2015-07-01 MED ORDER — FLUTICASONE PROPIONATE HFA 110 MCG/ACT IN AERO: 2.0000 | INHALATION_SPRAY | Freq: Two times a day (BID) | RESPIRATORY_TRACT | Status: DC

## 2015-07-01 MED ORDER — LISINOPRIL-HYDROCHLOROTHIAZIDE 20-12.5 MG PO TABS: 2.0000 | ORAL_TABLET | Freq: Every day | ORAL | Status: DC

## 2015-07-01 MED ORDER — SIMVASTATIN 20 MG PO TABS: ORAL_TABLET | ORAL | Status: DC

## 2015-07-01 NOTE — Progress Notes (Addendum)
Subjective:    Patient ID: Kathryn Gomez, female    DOB: [birth year], 75 y.o.   MRN: [id number] Chief Complaint  Patient presents with  . Hyperlipidemia  . Hypertension    HPI  Kathryn Gomez is a delightful 75 yo woman here for a routine 6 month follow-up on her chronic medical conditions.  This is my first time meeting this patient as her PCP is [facility].  HTN:  No longer has BP cuff at home.  At last visit, it was noted that since her BP was above goal, meds would be increased but instead lisinopril-hctz 40-25 was instead decreased to 20-25 - likely oversight. Also on amlodipine 5 mg daily   Pt has poor health literacy - she is mad that she had to take so many pills, that she used to be on less pills but now on more which is more expensive for her but she doesn't feel any different. Adament that her medical problems are controlled since she is compliant with her medications but that she is unwilling to add additional medications despite the fact that she is not at goal.  She paid $150 to have a lifeline screening done - showed she had mild carotid artery disease on one side, nml on the other. 4 lead EKG that showed she did not have a. Fib. She had a test for peripheral arterial disease that was normal but they checked her Cr and reported it was 1.4 with a eGFR of 37 which is new - pt's renal function has been fine prior.  Pre-DM: hgb a1c 5.7 one yr prior -> 5.9 6 mos prior.  HPL: has been well controlled on simvastatin 20 - LDL has been about 70  Past Medical History  Diagnosis Date  . Asthma   . Allergy   . Cataract     bilateral  . Hypertension   . Hypercholesteremia   . Pneumonia     hx of  . Arthritis    Past Surgical History  Procedure Laterality Date  . Abdominal hysterectomy  Feb 1976    Pelvic pain (no cancer)  . Fracture surgery Right 20 years ago    two fingers  . Total hip arthroplasty Left 09/03/2012    Procedure: LEFT TOTAL HIP ARTHROPLASTY ANTERIOR APPROACH;   Surgeon: Mauri Pole, MD;  Location: [facility];  Service: Orthopedics;  Laterality: Left;   Current Outpatient Prescriptions on File Prior to Visit  Medication Sig Dispense Refill  . aspirin 81 MG tablet Take 81 mg by mouth daily.    Marland Kitchen loratadine (CLARITIN) 10 MG tablet Take 10 mg by mouth daily.    Marland Kitchen OVER THE COUNTER MEDICATION OTC PreserVision eye vitamin and mineral supplement taking one in the am and one at night    . Probiotic Product ([employer] PROBIOTIC MAXIMUM STRENGTH) CAPS Take 1 capsule by mouth daily. 60 capsule 5   No current facility-administered medications on file prior to visit.   Not on File Family History  Problem Relation Age of Onset  . Hypertension Mother   . Hypertension Maternal Grandmother   . Hypertension Brother   . Obesity Brother    Social History   Social History  . Marital Status: Widowed    Spouse Name: n/a  . Number of Children: 3  . Years of Education: 11th grade   Occupational History  . line server [employer]   Social History Main Topics  . Smoking status: Never Smoker   .  Smokeless tobacco: Never Used  . Alcohol Use: No  . Drug Use: No  . Sexual Activity: Not Asked   Other Topics Concern  . None   Social History Narrative   Does not exercise.    Grandson lives with her.   Her great grandsons stay with her on the weekends.   Has a very strong Faith.     Review of Systems  Constitutional: Positive for fatigue. Negative for fever, chills, diaphoresis and appetite change.  Eyes: Negative for visual disturbance.  Respiratory: Negative for cough and shortness of breath.   Cardiovascular: Negative for chest pain, palpitations and leg swelling.  Genitourinary: Negative for decreased urine volume.  Neurological: Negative for syncope and headaches.  Hematological: Does not bruise/bleed easily.       Objective:  BP 140/81 mmHg  Pulse 74  Temp(Src) 98.3 F (36.8 C) (Oral)  Resp 16  Ht '5\' 6"'$  (1.676 m)  Wt 136 lb 12.8 oz  (62.052 kg)  BMI 22.09 kg/m2  Physical Exam  Constitutional: She is oriented to person, place, and time. She appears well-developed and well-nourished. No distress.  HENT:  Head: Normocephalic and atraumatic.  Right Ear: External ear normal.  Left Ear: External ear normal.  Eyes: Conjunctivae are normal. No scleral icterus.  Neck: Normal range of motion. Neck supple. No thyromegaly present.  Cardiovascular: Normal rate, regular rhythm, normal heart sounds and intact distal pulses.   Pulmonary/Chest: Effort normal and breath sounds normal. No respiratory distress.  Musculoskeletal: She exhibits no edema.  Lymphadenopathy:    She has no cervical adenopathy.  Neurological: She is alert and oriented to person, place, and time.  Skin: Skin is warm and dry. She is not diaphoretic. No erythema.  Psychiatric: She has a normal mood and affect. Her behavior is normal.          Assessment & Plan:  Ok to take some advil. eGFR today 60. 1. Essential hypertension  - increase hyzaar to 40-25 qd from 20-25. Cont amlodipine 5.  2. Medication monitoring encounter   3. High cholesterol - on simvastatin 20, lipids fantastic, continue  4. Asthma, mild persistent, uncomplicated    Recheck 6 mos  Orders Placed This Encounter  Procedures  . Comprehensive metabolic panel    Order Specific Question:  Has the patient fasted?    Answer:  Yes  . Lipid panel    Order Specific Question:  Has the patient fasted?    Answer:  Yes    Meds ordered this encounter  Medications  . lisinopril-hydrochlorothiazide (ZESTORETIC) 20-12.5 MG tablet    Sig: Take 2 tablets by mouth daily.    Dispense:  180 tablet    Refill:  3  . simvastatin (ZOCOR) 20 MG tablet    Sig: TAKE 1 TABLET BY MOUTH DAILY    Dispense:  90 tablet    Refill:  3  . amLODipine (NORVASC) 5 MG tablet    Sig: TAKE 1 TABLET BY MOUTH EVERY DAY .    Dispense:  90 tablet    Refill:  3  . albuterol (PROVENTIL HFA;VENTOLIN HFA) 108 (90 Base)  MCG/ACT inhaler    Sig: Inhale 2 puffs into the lungs every 6 (six) hours as needed for wheezing or shortness of breath.    Dispense:  1 Inhaler    Refill:  12  . fluticasone (FLOVENT HFA) 110 MCG/ACT inhaler    Sig: Inhale 2 puffs into the lungs 2 (two) times daily.    Dispense:  1  Inhaler    Refill:  12    Delman Cheadle, M.D.  [facility] [address] [place], [place] [address] [phone] phone [phone] fax  07/06/2015 9:38 PM

## 2015-07-01 NOTE — Patient Instructions (Addendum)
IF you received an x-ray today, you will receive an invoice from [facility]. Please contact [facility] at [phone] with questions or concerns regarding your invoice.   IF you received labwork today, you will receive an invoice from [facility]. Please contact [facility] at [phone] with questions or concerns regarding your invoice.   Our billing staff will not be able to assist you with questions regarding bills from these companies.  You will be contacted with the lab results as soon as they are available. The fastest way to get your results is to activate your My Chart account. Instructions are located on the last page of this paperwork. If you have not heard from us regarding the results in 2 weeks, please contact this office.  DASH Eating Plan DASH stands for "Dietary Approaches to Stop Hypertension." The DASH eating plan is a healthy eating plan that has been shown to reduce high blood pressure (hypertension). Additional health benefits may include reducing the risk of type 2 diabetes mellitus, heart disease, and stroke. The DASH eating plan may also help with weight loss. WHAT DO I NEED TO KNOW ABOUT THE DASH EATING PLAN? For the DASH eating plan, you will follow these general guidelines:  Choose foods with a percent daily value for sodium of less than 5% (as listed on the food label).  Use salt-free seasonings or herbs instead of table salt or sea salt.  Check with your health care provider or pharmacist before using salt substitutes.  Eat lower-sodium products, often labeled as "lower sodium" or "no salt added."  Eat fresh foods.  Eat more vegetables, fruits, and low-fat dairy products.  Choose whole grains. Look for the word "whole" as the first word in the ingredient list.  Choose fish and skinless chicken or turkey more often than red meat. Limit fish, poultry, and meat to 6 oz (170 g) each day.  Limit sweets, desserts,  sugars, and sugary drinks.  Choose heart-healthy fats.  Limit cheese to 1 oz (28 g) per day.  Eat more home-cooked food and less restaurant, buffet, and fast food.  Limit fried foods.  Cook foods using methods other than frying.  Limit canned vegetables. If you do use them, rinse them well to decrease the sodium.  When eating at a restaurant, ask that your food be prepared with less salt, or no salt if possible. WHAT FOODS CAN I EAT? Seek help from a dietitian for individual calorie needs. Grains Whole grain or whole wheat bread. Brown rice. Whole grain or whole wheat pasta. Quinoa, bulgur, and whole grain cereals. Low-sodium cereals. Corn or whole wheat flour tortillas. Whole grain cornbread. Whole grain crackers. Low-sodium crackers. Vegetables Fresh or frozen vegetables (raw, steamed, roasted, or grilled). Low-sodium or reduced-sodium tomato and vegetable juices. Low-sodium or reduced-sodium tomato sauce and paste. Low-sodium or reduced-sodium canned vegetables.  Fruits All fresh, canned (in natural juice), or frozen fruits. Meat and Other Protein Products Ground beef (85% or leaner), grass-fed beef, or beef trimmed of fat. Skinless chicken or turkey. Ground chicken or turkey. Pork trimmed of fat. All fish and seafood. Eggs. Dried beans, peas, or lentils. Unsalted nuts and seeds. Unsalted canned beans. Dairy Low-fat dairy products, such as skim or 1% milk, 2% or reduced-fat cheeses, low-fat ricotta or cottage cheese, or plain low-fat yogurt. Low-sodium or reduced-sodium cheeses. Fats and Oils Tub margarines without trans fats. Light or reduced-fat mayonnaise and salad dressings (reduced sodium). Avocado. Safflower, olive, or canola oils. Natural peanut or almond butter.   Other Unsalted popcorn and pretzels. The items listed above may not be a complete list of recommended foods or beverages. Contact your dietitian for more options. WHAT FOODS ARE NOT RECOMMENDED? Grains White  bread. White pasta. White rice. Refined cornbread. Bagels and croissants. Crackers that contain trans fat. Vegetables Creamed or fried vegetables. Vegetables in a cheese sauce. Regular canned vegetables. Regular canned tomato sauce and paste. Regular tomato and vegetable juices. Fruits Dried fruits. Canned fruit in light or heavy syrup. Fruit juice. Meat and Other Protein Products Fatty cuts of meat. Ribs, chicken wings, bacon, sausage, bologna, salami, chitterlings, fatback, hot dogs, bratwurst, and packaged luncheon meats. Salted nuts and seeds. Canned beans with salt. Dairy Whole or 2% milk, cream, half-and-half, and cream cheese. Whole-fat or sweetened yogurt. Full-fat cheeses or blue cheese. Nondairy creamers and whipped toppings. Processed cheese, cheese spreads, or cheese curds. Condiments Onion and garlic salt, seasoned salt, table salt, and sea salt. Canned and packaged gravies. Worcestershire sauce. Tartar sauce. Barbecue sauce. Teriyaki sauce. Soy sauce, including reduced sodium. Steak sauce. Fish sauce. Oyster sauce. Cocktail sauce. Horseradish. Ketchup and mustard. Meat flavorings and tenderizers. Bouillon cubes. Hot sauce. Tabasco sauce. Marinades. Taco seasonings. Relishes. Fats and Oils Butter, stick margarine, lard, shortening, ghee, and bacon fat. Coconut, palm kernel, or palm oils. Regular salad dressings. Other Pickles and olives. Salted popcorn and pretzels. The items listed above may not be a complete list of foods and beverages to avoid. Contact your dietitian for more information. WHERE CAN I FIND MORE INFORMATION? National Heart, Lung, and Blood Institute: www.nhlbi.nih.gov/health/health-topics/topics/dash/   This information is not intended to replace advice given to you by your health care provider. Make sure you discuss any questions you have with your health care provider.   Document Released: 01/05/2011 Document Revised: 02/06/2014 Document Reviewed:  11/20/2012 Elsevier Interactive Patient Education 2016 Elsevier Inc. Managing Your High Blood Pressure Blood pressure is a measurement of how forceful your blood is pressing against the walls of the arteries. Arteries are muscular tubes within the circulatory system. Blood pressure does not stay the same. Blood pressure rises when you are active, excited, or nervous; and it lowers during sleep and relaxation. If the numbers measuring your blood pressure stay above normal most of the time, you are at risk for health problems. High blood pressure (hypertension) is a long-term (chronic) condition in which blood pressure is elevated. A blood pressure reading is recorded as two numbers, such as 120 over 80 (or 120/80). The first, higher number is called the systolic pressure. It is a measure of the pressure in your arteries as the heart beats. The second, lower number is called the diastolic pressure. It is a measure of the pressure in your arteries as the heart relaxes between beats.  Keeping your blood pressure in a normal range is important to your overall health and prevention of health problems, such as heart disease and stroke. When your blood pressure is uncontrolled, your heart has to work harder than normal. High blood pressure is a very common condition in adults because blood pressure tends to rise with age. Men and women are equally likely to have hypertension but at different times in life. Before age 45, men are more likely to have hypertension. After 76 years of age, women are more likely to have it. Hypertension is especially common in African Americans. This condition often has no signs or symptoms. The cause of the condition is usually not known. Your caregiver can help you come up with   a plan to keep your blood pressure in a normal, healthy range. BLOOD PRESSURE STAGES Blood pressure is classified into four stages: normal, prehypertension, stage 1, and stage 2. Your blood pressure reading will  be used to determine what type of treatment, if any, is necessary. Appropriate treatment options are tied to these four stages:  Normal  Systolic pressure (mm Hg): below 120.  Diastolic pressure (mm Hg): below 80. Prehypertension  Systolic pressure (mm Hg): 120 to 139.  Diastolic pressure (mm Hg): 80 to 89. Stage1  Systolic pressure (mm Hg): 140 to 159.  Diastolic pressure (mm Hg): 90 to 99. Stage2  Systolic pressure (mm Hg): 160 or above.  Diastolic pressure (mm Hg): 100 or above. RISKS RELATED TO HIGH BLOOD PRESSURE Managing your blood pressure is an important responsibility. Uncontrolled high blood pressure can lead to:  A heart attack.  A stroke.  A weakened blood vessel (aneurysm).  Heart failure.  Kidney damage.  Eye damage.  Metabolic syndrome.  Memory and concentration problems. HOW TO MANAGE YOUR BLOOD PRESSURE Blood pressure can be managed effectively with lifestyle changes and medicines (if needed). Your caregiver will help you come up with a plan to bring your blood pressure within a normal range. Your plan should include the following: Education  Read all information provided by your caregivers about how to control blood pressure.  Educate yourself on the latest guidelines and treatment recommendations. New research is always being done to further define the risks and treatments for high blood pressure. Lifestylechanges  Control your weight.  Avoid smoking.  Stay physically active.  Reduce the amount of salt in your diet.  Reduce stress.  Control any chronic conditions, such as high cholesterol or diabetes.  Reduce your alcohol intake. Medicines  Several medicines (antihypertensive medicines) are available, if needed, to bring blood pressure within a normal range. Communication  Review all the medicines you take with your caregiver because there may be side effects or interactions.  Talk with your caregiver about your diet, exercise  habits, and other lifestyle factors that may be contributing to high blood pressure.  See your caregiver regularly. Your caregiver can help you create and adjust your plan for managing high blood pressure. RECOMMENDATIONS FOR TREATMENT AND FOLLOW-UP  The following recommendations are based on current guidelines for managing high blood pressure in nonpregnant adults. Use these recommendations to identify the proper follow-up period or treatment option based on your blood pressure reading. You can discuss these options with your caregiver.  Systolic pressure of 120 to 139 or diastolic pressure of 80 to 89: Follow up with your caregiver as directed.  Systolic pressure of 140 to 160 or diastolic pressure of 90 to 100: Follow up with your caregiver within 2 months.  Systolic pressure above 160 or diastolic pressure above 100: Follow up with your caregiver within 1 month.  Systolic pressure above 180 or diastolic pressure above 110: Consider antihypertensive therapy; follow up with your caregiver within 1 week.  Systolic pressure above 200 or diastolic pressure above 120: Begin antihypertensive therapy; follow up with your caregiver within 1 week.   This information is not intended to replace advice given to you by your health care provider. Make sure you discuss any questions you have with your health care provider.   Document Released: 10/11/2011 Document Reviewed: 10/11/2011 Elsevier Interactive Patient Education 2016 Elsevier Inc.  

## 2015-07-05 ENCOUNTER — Other Ambulatory Visit: Payer: Self-pay | Admitting: Family Medicine

## 2015-07-14 ENCOUNTER — Encounter: Payer: Self-pay | Admitting: Family Medicine

## 2015-09-30 ENCOUNTER — Other Ambulatory Visit: Payer: Self-pay | Admitting: Family Medicine

## 2015-10-05 ENCOUNTER — Ambulatory Visit (INDEPENDENT_AMBULATORY_CARE_PROVIDER_SITE_OTHER): Payer: Medicare Other | Admitting: Family Medicine

## 2015-10-05 ENCOUNTER — Encounter: Payer: Self-pay | Admitting: Family Medicine

## 2015-10-05 VITALS — BP 138/76 | HR 67 | Temp 98.4°F | Resp 18 | Ht 64.0 in | Wt 138.8 lb

## 2015-10-05 DIAGNOSIS — M545 Low back pain, unspecified: Secondary | ICD-10-CM | POA: Insufficient documentation

## 2015-10-05 MED ORDER — CYCLOBENZAPRINE HCL 5 MG PO TABS: 5.0000 mg | ORAL_TABLET | Freq: Every day | ORAL | 0 refills | Status: DC

## 2015-10-05 NOTE — Assessment & Plan Note (Signed)
Will give exercises to do for stretching and strengthening.  Prescribed flexeril, but gave precautions about drowsiness.  May take tylenol or small amounts of ibuprofen as needed.  Follow up as needed.  Gave red flag signs- bowel/bladder incontinence, radiculopathy, numbness/tingling.

## 2015-10-05 NOTE — Progress Notes (Signed)
  Marland Mcalpinelma W Vos - 75 y.o. female MRN [id number]  Date of birth: 12/30/1940  SUBJECTIVE:  Including CC & ROS.  CC: low back pain   Pleasant 75 y/o female who presents with low back pain of 1 day duration.  She was sitting in her car when these developed.  She works [employer] in [employer].  She denies radicular pain, numbness, tingling, bowel/bladder incontinence, fevers, chills.  It is localized to her low back and can be severe in nature.  States that it feels crampy.  No history of smoking, cancer.  Has had this happened and it improved with muscle relaxers.     ROS: No unexpected weight loss, fever, chills, swelling, instability, muscle pain, numbness/tingling, redness, otherwise see HPI   PMHx - Updated and reviewed.  Contributory factors include: HTN PSHx - Updated and reviewed.  Contributory factors include:  Negative FHx - Updated and reviewed.  Contributory factors include:  Negative Social Hx - Updated and reviewed. Contributory factors include: No smoking, alcohol use, steroid use Medications - reviewed   DATA REVIEWED: Previous office notes  PHYSICAL EXAM:  VS: BP:138/76  HR:67bpm  TEMP:98.4 F (36.9 C)(Oral)  RESP:97 %  HT:5\' 4"  (162.6 cm)   WT:138 lb 12.8 oz (63 kg)  BMI:23.9 PHYSICAL EXAM: Gen: NAD, alert, cooperative with exam, well-appearing HEENT: clear conjunctiva,  CV:  no edema, capillary refill brisk, normal rate Resp: non-labored Skin: no rashes, normal turgor  Neuro: no gross deficits.  Psych:  alert and oriented Back Exam:  Inspection: Unremarkable  Palpable tenderness: None. Range of Motion:  Flexion 45 deg; Extension 45 deg; Side Bending to 45 deg bilaterally; Rotation to 45 deg bilaterally  Leg strength: Quad: 5/5 Hamstring: 5/5 Hip flexor: 5/5 Hip abductors: 5/5  Strength at foot: Plantar-flexion: 5/5 Dorsi-flexion: 5/5 Eversion: 5/5 Inversion: 5/5  Sensory change: Gross sensation intact to all lumbar and sacral dermatomes.  Reflexes: 2+ at  both patellar tendons, 2+ at achilles tendons, Babinski's downgoing.  Gait unremarkable. SLR laying: Negative  XSLR laying: Negative   ASSESSMENT & PLAN:   Bilateral low back pain without sciatica Will give exercises to do for stretching and strengthening.  Prescribed flexeril, but gave precautions about drowsiness.  May take tylenol or small amounts of ibuprofen as needed.  Follow up as needed.  Gave red flag signs- bowel/bladder incontinence, radiculopathy, numbness/tingling.    Signed,  Corliss MarcusAlicia Barnes, DO [facility] 9:27 PM  10/05/15

## 2015-10-05 NOTE — Patient Instructions (Addendum)
Take flexeril with caution, as it can cause drowsiness.  Please return if you have worsening symptoms or radiculopathy, numbness, tingling, problems with bowel or bladder as discussed.   Low Back Strain With Rehab A strain is an injury in which a tendon or muscle is torn. The muscles and tendons of the lower back are vulnerable to strains. However, these muscles and tendons are very strong and require a great force to be injured. Strains are classified into three categories. Grade 1 strains cause pain, but the tendon is not lengthened. Grade 2 strains include a lengthened ligament, due to the ligament being stretched or partially ruptured. With grade 2 strains there is still function, although the function may be decreased. Grade 3 strains involve a complete tear of the tendon or muscle, and function is usually impaired. SYMPTOMS   Pain in the lower back.  Pain that affects one side more than the other.  Pain that gets worse with movement and may be felt in the hip, buttocks, or back of the thigh.  Muscle spasms of the muscles in the back.  Swelling along the muscles of the back.  Loss of strength of the back muscles.  Crackling sound (crepitation) when the muscles are touched. CAUSES  Lower back strains occur when a force is placed on the muscles or tendons that is greater than they can handle. Common causes of injury include:  Prolonged overuse of the muscle-tendon units in the lower back, usually from incorrect posture.  A single violent injury or force applied to the back. RISK INCREASES WITH:  Sports that involve twisting forces on the spine or a lot of bending at the waist (football, rugby, weightlifting, bowling, golf, tennis, speed skating, racquetball, swimming, running, gymnastics, diving).  Poor strength and flexibility.  Failure to warm up properly before activity.  Family history of lower back pain or disk disorders.  Previous back injury or surgery (especially  fusion).  Poor posture with lifting, especially heavy objects.  Prolonged sitting, especially with poor posture. PREVENTION   Learn and use proper posture when sitting or lifting (maintain proper posture when sitting, lift using the knees and legs, not at the waist).  Warm up and stretch properly before activity.  Allow for adequate recovery between workouts.  Maintain physical fitness:  Strength, flexibility, and endurance.  Cardiovascular fitness. PROGNOSIS  If treated properly, lower back strains usually heal within 6 weeks. RELATED COMPLICATIONS   Recurring symptoms, resulting in a chronic problem.  Chronic inflammation, scarring, and partial muscle-tendon tear.  Delayed healing or resolution of symptoms.  Prolonged disability. TREATMENT  Treatment first involves the use of ice and medicine, to reduce pain and inflammation. The use of strengthening and stretching exercises may help reduce pain with activity. These exercises may be performed at home or with a therapist. Severe injuries may require referral to a therapist for further evaluation and treatment, such as ultrasound. Your caregiver may advise that you wear a back brace or corset, to help reduce pain and discomfort. Often, prolonged bed rest results in greater harm then benefit. Corticosteroid injections may be recommended. However, these should be reserved for the most serious cases. It is important to avoid using your back when lifting objects. At night, sleep on your back on a firm mattress with a pillow placed under your knees. If non-surgical treatment is unsuccessful, surgery may be needed.  MEDICATION   If pain medicine is needed, nonsteroidal anti-inflammatory medicines (aspirin and ibuprofen), or other minor pain relievers (acetaminophen), are  often advised.  Do not take pain medicine for 7 days before surgery.  Prescription pain relievers may be given, if your caregiver thinks they are needed. Use only as  directed and only as much as you need.  Ointments applied to the skin may be helpful.  Corticosteroid injections may be given by your caregiver. These injections should be reserved for the most serious cases, because they may only be given a certain number of times. HEAT AND COLD  Cold treatment (icing) should be applied for 10 to 15 minutes every 2 to 3 hours for inflammation and pain, and immediately after activity that aggravates your symptoms. Use ice packs or an ice massage.  Heat treatment may be used before performing stretching and strengthening activities prescribed by your caregiver, physical therapist, or athletic trainer. Use a heat pack or a warm water soak. SEEK MEDICAL CARE IF:   Symptoms get worse or do not improve in 2 to 4 weeks, despite treatment.  You develop numbness, weakness, or loss of bowel or bladder function.  New, unexplained symptoms develop. (Drugs used in treatment may produce side effects.) EXERCISES  RANGE OF MOTION (ROM) AND STRETCHING EXERCISES - Low Back Strain Most people with lower back pain will find that their symptoms get worse with excessive bending forward (flexion) or arching at the lower back (extension). The exercises which will help resolve your symptoms will focus on the opposite motion.  Your physician, physical therapist or athletic trainer will help you determine which exercises will be most helpful to resolve your lower back pain. Do not complete any exercises without first consulting with your caregiver. Discontinue any exercises which make your symptoms worse until you speak to your caregiver.  If you have pain, numbness or tingling which travels down into your buttocks, leg or foot, the goal of the therapy is for these symptoms to move closer to your back and eventually resolve. Sometimes, these leg symptoms will get better, but your lower back pain may worsen. This is typically an indication of progress in your rehabilitation. Be very alert  to any changes in your symptoms and the activities in which you participated in the 24 hours prior to the change. Sharing this information with your caregiver will allow him/her to most efficiently treat your condition.  These exercises may help you when beginning to rehabilitate your injury. Your symptoms may resolve with or without further involvement from your physician, physical therapist or athletic trainer. While completing these exercises, remember:  Restoring tissue flexibility helps normal motion to return to the joints. This allows healthier, less painful movement and activity.  An effective stretch should be held for at least 30 seconds.  A stretch should never be painful. You should only feel a gentle lengthening or release in the stretched tissue. FLEXION RANGE OF MOTION AND STRETCHING EXERCISES: STRETCH - Flexion, Single Knee to Chest   Lie on a firm bed or floor with both legs extended in front of you.  Keeping one leg in contact with the floor, bring your opposite knee to your chest. Hold your leg in place by either grabbing behind your thigh or at your knee.  Pull until you feel a gentle stretch in your lower back. Hold __________ seconds.  Slowly release your grasp and repeat the exercise with the opposite side. Repeat __________ times. Complete this exercise __________ times per day.  STRETCH - Flexion, Double Knee to Chest   Lie on a firm bed or floor with both legs extended in  front of you.  Keeping one leg in contact with the floor, bring your opposite knee to your chest.  Tense your stomach muscles to support your back and then lift your other knee to your chest. Hold your legs in place by either grabbing behind your thighs or at your knees.  Pull both knees toward your chest until you feel a gentle stretch in your lower back. Hold __________ seconds.  Tense your stomach muscles and slowly return one leg at a time to the floor. Repeat __________ times. Complete  this exercise __________ times per day.  STRETCH - Low Trunk Rotation  Lie on a firm bed or floor. Keeping your legs in front of you, bend your knees so they are both pointed toward the ceiling and your feet are flat on the floor.  Extend your arms out to the side. This will stabilize your upper body by keeping your shoulders in contact with the floor.  Gently and slowly drop both knees together to one side until you feel a gentle stretch in your lower back. Hold for __________ seconds.  Tense your stomach muscles to support your lower back as you bring your knees back to the starting position. Repeat the exercise to the other side. Repeat __________ times. Complete this exercise __________ times per day  EXTENSION RANGE OF MOTION AND FLEXIBILITY EXERCISES: STRETCH - Extension, Prone on Elbows   Lie on your stomach on the floor, a bed will be too soft. Place your palms about shoulder width apart and at the height of your head.  Place your elbows under your shoulders. If this is too painful, stack pillows under your chest.  Allow your body to relax so that your hips drop lower and make contact more completely with the floor.  Hold this position for __________ seconds.  Slowly return to lying flat on the floor. Repeat __________ times. Complete this exercise __________ times per day.  RANGE OF MOTION - Extension, Prone Press Ups  Lie on your stomach on the floor, a bed will be too soft. Place your palms about shoulder width apart and at the height of your head.  Keeping your back as relaxed as possible, slowly straighten your elbows while keeping your hips on the floor. You may adjust the placement of your hands to maximize your comfort. As you gain motion, your hands will come more underneath your shoulders.  Hold this position __________ seconds.  Slowly return to lying flat on the floor. Repeat __________ times. Complete this exercise __________ times per day.  RANGE OF MOTION-  Quadruped, Neutral Spine   Assume a hands and knees position on a firm surface. Keep your hands under your shoulders and your knees under your hips. You may place padding under your knees for comfort.  Drop your head and point your tail bone toward the ground below you. This will round out your lower back like an angry cat. Hold this position for __________ seconds.  Slowly lift your head and release your tail bone so that your back sags into a large arch, like an old horse.  Hold this position for __________ seconds.  Repeat this until you feel limber in your lower back.  Now, find your "sweet spot." This will be the most comfortable position somewhere between the two previous positions. This is your neutral spine. Once you have found this position, tense your stomach muscles to support your lower back.  Hold this position for __________ seconds. Repeat __________ times. Complete this exercise  __________ times per day.  STRENGTHENING EXERCISES - Low Back Strain These exercises may help you when beginning to rehabilitate your injury. These exercises should be done near your "sweet spot." This is the neutral, low-back arch, somewhere between fully rounded and fully arched, that is your least painful position. When performed in this safe range of motion, these exercises can be used for people who have either a flexion or extension based injury. These exercises may resolve your symptoms with or without further involvement from your physician, physical therapist or athletic trainer. While completing these exercises, remember:   Muscles can gain both the endurance and the strength needed for everyday activities through controlled exercises.  Complete these exercises as instructed by your physician, physical therapist or athletic trainer. Increase the resistance and repetitions only as guided.  You may experience muscle soreness or fatigue, but the pain or discomfort you are trying to eliminate  should never worsen during these exercises. If this pain does worsen, stop and make certain you are following the directions exactly. If the pain is still present after adjustments, discontinue the exercise until you can discuss the trouble with your caregiver. STRENGTHENING - Deep Abdominals, Pelvic Tilt  Lie on a firm bed or floor. Keeping your legs in front of you, bend your knees so they are both pointed toward the ceiling and your feet are flat on the floor.  Tense your lower abdominal muscles to press your lower back into the floor. This motion will rotate your pelvis so that your tail bone is scooping upwards rather than pointing at your feet or into the floor.  With a gentle tension and even breathing, hold this position for __________ seconds. Repeat __________ times. Complete this exercise __________ times per day.  STRENGTHENING - Abdominals, Crunches   Lie on a firm bed or floor. Keeping your legs in front of you, bend your knees so they are both pointed toward the ceiling and your feet are flat on the floor. Cross your arms over your chest.  Slightly tip your chin down without bending your neck.  Tense your abdominals and slowly lift your trunk high enough to just clear your shoulder blades. Lifting higher can put excessive stress on the lower back and does not further strengthen your abdominal muscles.  Control your return to the starting position. Repeat __________ times. Complete this exercise __________ times per day.  STRENGTHENING - Quadruped, Opposite UE/LE Lift   Assume a hands and knees position on a firm surface. Keep your hands under your shoulders and your knees under your hips. You may place padding under your knees for comfort.  Find your neutral spine and gently tense your abdominal muscles so that you can maintain this position. Your shoulders and hips should form a rectangle that is parallel with the floor and is not twisted.  Keeping your trunk steady, lift  your right hand no higher than your shoulder and then your left leg no higher than your hip. Make sure you are not holding your breath. Hold this position __________ seconds.  Continuing to keep your abdominal muscles tense and your back steady, slowly return to your starting position. Repeat with the opposite arm and leg. Repeat __________ times. Complete this exercise __________ times per day.  STRENGTHENING - Lower Abdominals, Double Knee Lift  Lie on a firm bed or floor. Keeping your legs in front of you, bend your knees so they are both pointed toward the ceiling and your feet are flat on the floor.  Tense your abdominal muscles to brace your lower back and slowly lift both of your knees until they come over your hips. Be certain not to hold your breath.  Hold __________ seconds. Using your abdominal muscles, return to the starting position in a slow and controlled manner. Repeat __________ times. Complete this exercise __________ times per day.  POSTURE AND BODY MECHANICS CONSIDERATIONS - Low Back Strain Keeping correct posture when sitting, standing or completing your activities will reduce the stress put on different body tissues, allowing injured tissues a chance to heal and limiting painful experiences. The following are general guidelines for improved posture. Your physician or physical therapist will provide you with any instructions specific to your needs. While reading these guidelines, remember:  The exercises prescribed by your provider will help you have the flexibility and strength to maintain correct postures.  The correct posture provides the best environment for your joints to work. All of your joints have less wear and tear when properly supported by a spine with good posture. This means you will experience a healthier, less painful body.  Correct posture must be practiced with all of your activities, especially prolonged sitting and standing. Correct posture is as important  when doing repetitive low-stress activities (typing) as it is when doing a single heavy-load activity (lifting). RESTING POSITIONS Consider which positions are most painful for you when choosing a resting position. If you have pain with flexion-based activities (sitting, bending, stooping, squatting), choose a position that allows you to rest in a less flexed posture. You would want to avoid curling into a fetal position on your side. If your pain worsens with extension-based activities (prolonged standing, working overhead), avoid resting in an extended position such as sleeping on your stomach. Most people will find more comfort when they rest with their spine in a more neutral position, neither too rounded nor too arched. Lying on a non-sagging bed on your side with a pillow between your knees, or on your back with a pillow under your knees will often provide some relief. Keep in mind, being in any one position for a prolonged period of time, no matter how correct your posture, can still lead to stiffness. PROPER SITTING POSTURE In order to minimize stress and discomfort on your spine, you must sit with correct posture. Sitting with good posture should be effortless for a healthy body. Returning to good posture is a gradual process. Many people can work toward this most comfortably by using various supports until they have the flexibility and strength to maintain this posture on their own. When sitting with proper posture, your ears will fall over your shoulders and your shoulders will fall over your hips. You should use the back of the chair to support your upper back. Your lower back will be in a neutral position, just slightly arched. You may place a small pillow or folded towel at the base of your lower back for support.  When working at a desk, create an environment that supports good, upright posture. Without extra support, muscles tire, which leads to excessive strain on joints and other tissues.  Keep these recommendations in mind: CHAIR:  A chair should be able to slide under your desk when your back makes contact with the back of the chair. This allows you to work closely.  The chair's height should allow your eyes to be level with the upper part of your monitor and your hands to be slightly lower than your elbows. BODY POSITION  Your feet  should make contact with the floor. If this is not possible, use a foot rest.  Keep your ears over your shoulders. This will reduce stress on your neck and lower back. INCORRECT SITTING POSTURES  If you are feeling tired and unable to assume a healthy sitting posture, do not slouch or slump. This puts excessive strain on your back tissues, causing more damage and pain. Healthier options include:  Using more support, like a lumbar pillow.  Switching tasks to something that requires you to be upright or walking.  Talking a brief walk.  Lying down to rest in a neutral-spine position. PROLONGED STANDING WHILE SLIGHTLY LEANING FORWARD  When completing a task that requires you to lean forward while standing in one place for a long time, place either foot up on a stationary 2-4 inch high object to help maintain the best posture. When both feet are on the ground, the lower back tends to lose its slight inward curve. If this curve flattens (or becomes too large), then the back and your other joints will experience too much stress, tire more quickly, and can cause pain. CORRECT STANDING POSTURES Proper standing posture should be assumed with all daily activities, even if they only take a few moments, like when brushing your teeth. As in sitting, your ears should fall over your shoulders and your shoulders should fall over your hips. You should keep a slight tension in your abdominal muscles to brace your spine. Your tailbone should point down to the ground, not behind your body, resulting in an over-extended swayback posture.  INCORRECT STANDING POSTURES   Common incorrect standing postures include a forward head, locked knees and/or an excessive swayback. WALKING Walk with an upright posture. Your ears, shoulders and hips should all line-up. PROLONGED ACTIVITY IN A FLEXED POSITION When completing a task that requires you to bend forward at your waist or lean over a low surface, try to find a way to stabilize 3 out of 4 of your limbs. You can place a hand or elbow on your thigh or rest a knee on the surface you are reaching across. This will provide you more stability so that your muscles do not fatigue as quickly. By keeping your knees relaxed, or slightly bent, you will also reduce stress across your lower back. CORRECT LIFTING TECHNIQUES DO :   Assume a wide stance. This will provide you more stability and the opportunity to get as close as possible to the object which you are lifting.  Tense your abdominals to brace your spine. Bend at the knees and hips. Keeping your back locked in a neutral-spine position, lift using your leg muscles. Lift with your legs, keeping your back straight.  Test the weight of unknown objects before attempting to lift them.  Try to keep your elbows locked down at your sides in order get the best strength from your shoulders when carrying an object.  Always ask for help when lifting heavy or awkward objects. INCORRECT LIFTING TECHNIQUES DO NOT:   Lock your knees when lifting, even if it is a small object.  Bend and twist. Pivot at your feet or move your feet when needing to change directions.  Assume that you can safely pick up even a paper clip without proper posture.   This information is not intended to replace advice given to you by your health care provider. Make sure you discuss any questions you have with your health care provider.   Document Released: 01/16/2005 Document Revised: 02/06/2014 Document  Reviewed: 04/30/2008 [employer] Interactive Patient Education [employer]    IF you  received an x-ray today, you will receive an invoice from [facility]. Please contact [facility] at [phone] with questions or concerns regarding your invoice.   IF you received labwork today, you will receive an invoice from [employer]. Please contact [facility] at [phone] with questions or concerns regarding your invoice.   Our billing staff will not be able to assist you with questions regarding bills from these companies.  You will be contacted with the lab results as soon as they are available. The fastest way to get your results is to activate your My Chart account. Instructions are located on the last page of this paperwork. If you have not heard from [place] regarding the results in 2 weeks, please contact this office.

## 2015-10-06 NOTE — Progress Notes (Signed)
Patient discussed with Dr. Barnes. Agree with findings, assessment and plan of care per her note.   

## 2016-01-05 NOTE — Progress Notes (Signed)
Subjective:    Kathryn Gomez is a 75 y.o. female who presents for Medicare Annual/Subsequent preventive examination.  I last saw pt 6 mos prior for a review of her chronic medical conditions.  Her last AWV was done over 2 yrs prior by a former colleague Dr. Leward Quan. She eats breakfast every morning but is fasting today.  Preventive Screening-Counseling & Management  Tobacco History  Smoking Status  . Never Smoker  Smokeless Tobacco  . Never Used     Problems Prior to Visit 1. HTN:  Does not monitor her BP outside of the office. Meds were increased at last visit 6 mos prior to lisinopril-hctz 40-25 and amlodipine 5.  Her BP is checked occ at home by her daughter - was normal at least check.  2. Last eGFR 50 6 mos piror  3. Poor health literacy and compliance - reports she rarely misses any  Med doses..  4. Pre-DM: hgb a1c 5.7 one yr prior -> 5.9 1 yr prior.  5. HPL: has been well controlled on simvastatin 20 - LDL has been about 70  Current Problems (verified) Patient Active Problem List   Diagnosis Date Noted  . Bilateral low back pain without sciatica 10/05/2015  . Diarrhea 09/24/2014  . Expected blood loss anemia 09/04/2012  . Overweight (BMI 25.0-29.9) 09/04/2012  . S/P left THA, AA 09/03/2012  . High cholesterol 03/26/2011  . Hypertension 03/26/2011  . Asthma 03/26/2011    Medications Prior to Visit Current Outpatient Prescriptions on File Prior to Visit  Medication Sig Dispense Refill  . albuterol (PROVENTIL HFA;VENTOLIN HFA) 108 (90 Base) MCG/ACT inhaler Inhale 2 puffs into the lungs every 6 (six) hours as needed for wheezing or shortness of breath. 1 Inhaler 12  . amLODipine (NORVASC) 5 MG tablet TAKE 1 TABLET BY MOUTH EVERY DAY . 90 tablet 3  . aspirin 81 MG tablet Take 81 mg by mouth daily.    . cyclobenzaprine (FLEXERIL) 5 MG tablet Take 1 tablet (5 mg total) by mouth at bedtime. 30 tablet 0  . fluticasone (FLOVENT HFA) 110 MCG/ACT inhaler Inhale 2 puffs  into the lungs 2 (two) times daily. (Patient not taking: Reported on 10/05/2015) 1 Inhaler 12  . lisinopril-hydrochlorothiazide (ZESTORETIC) 20-12.5 MG tablet Take 2 tablets by mouth daily. 180 tablet 3  . loratadine (CLARITIN) 10 MG tablet Take 10 mg by mouth daily.    Marland Kitchen OVER THE COUNTER MEDICATION OTC PreserVision eye vitamin and mineral supplement taking one in the am and one at night    . Probiotic Product ([employer] PROBIOTIC MAXIMUM STRENGTH) CAPS Take 1 capsule by mouth daily. 60 capsule 5  . simvastatin (ZOCOR) 20 MG tablet TAKE 1 TABLET BY MOUTH DAILY 90 tablet 3   No current facility-administered medications on file prior to visit.     Current Medications (verified) Current Outpatient Prescriptions  Medication Sig Dispense Refill  . albuterol (PROVENTIL HFA;VENTOLIN HFA) 108 (90 Base) MCG/ACT inhaler Inhale 2 puffs into the lungs every 6 (six) hours as needed for wheezing or shortness of breath. 1 Inhaler 12  . amLODipine (NORVASC) 5 MG tablet TAKE 1 TABLET BY MOUTH EVERY DAY . 90 tablet 3  . aspirin 81 MG tablet Take 81 mg by mouth daily.    . cyclobenzaprine (FLEXERIL) 5 MG tablet Take 1 tablet (5 mg total) by mouth at bedtime. 30 tablet 0  . fluticasone (FLOVENT HFA) 110 MCG/ACT inhaler Inhale 2 puffs into the lungs 2 (two) times daily. (Patient not taking: Reported  on 10/05/2015) 1 Inhaler 12  . lisinopril-hydrochlorothiazide (ZESTORETIC) 20-12.5 MG tablet Take 2 tablets by mouth daily. 180 tablet 3  . loratadine (CLARITIN) 10 MG tablet Take 10 mg by mouth daily.    Marland Kitchen OVER THE COUNTER MEDICATION OTC PreserVision eye vitamin and mineral supplement taking one in the am and one at night    . Probiotic Product ([employer] PROBIOTIC MAXIMUM STRENGTH) CAPS Take 1 capsule by mouth daily. 60 capsule 5  . simvastatin (ZOCOR) 20 MG tablet TAKE 1 TABLET BY MOUTH DAILY 90 tablet 3   No current facility-administered medications for this visit.      Allergies (verified) Patient has no known allergies.    PAST HISTORY  Family History Family History  Problem Relation Age of Onset  . Hypertension Mother   . Hypertension Maternal Grandmother   . Hypertension Brother   . Obesity Brother     Social History Social History  Substance Use Topics  . Smoking status: Never Smoker  . Smokeless tobacco: Never Used  . Alcohol use No     Are there smokers in your home (other than you)? No  Risk Factors Current exercise habits: The patient does not participate in regular exercise at present.  Dietary issues discussed: eats normal diet, no restrictions or efforts   Cardiac risk factors: advanced age (older than 76 for men, 47 for women), dyslipidemia, hypertension and sedentary lifestyle.  Depression Screen Depression screen [facility] 2/9 01/06/2016 10/05/2015 07/01/2015 04/22/2015 04/04/2015  Decreased Interest 0 0 0 0 0  Down, Depressed, Hopeless 0 0 0 0 0  PHQ - 2 Score 0 0 0 0 0    Activities of Daily Living In your present state of health, do you have any difficulty performing the following activities?:  Driving? No Managing money?  No Feeding yourself? No Getting from bed to chair? No Climbing a flight of stairs? No Preparing food and eating?: No Bathing or showering? No Getting dressed: No Getting to the toilet? No Using the toilet:No Moving around from place to place: No In the past year have you fallen or had a near fall?:No   Are you sexually active?  No  Do you have more than one partner?  No  Hearing Difficulties: No Do you often ask people to speak up or repeat themselves? No Do you experience ringing or noises in your ears? No Do you have difficulty understanding soft or whispered voices? No   Do you feel that you have a problem with memory? No  Do you often misplace items? No  Do you feel safe at home?  No  Cognitive Testing  Alert? Yes  Normal Appearance?Yes  Oriented to person? Yes  Place? Yes   Time? Yes  Displays appropriate judgment?Yes   Advanced Directives  have been discussed with the patient? Yes  List the Names of Other Physician/Practitioners you currently use: 1.  Dr. Durene Romans - ortho 2.  Dr. Mia Creek - optho 3. Sees dentist annual 4. Dr. Elnoria Howard GI  Indicate any recent Medical Services you may have received from other than [facility] providers in the past year (date may be approximate).  Immunization History  Administered Date(s) Administered  . Influenza Split 11/29/2011  . Influenza,inj,Quad PF,36+ Mos 11/14/2012, 11/27/2013, 12/29/2014  . Pneumococcal Conjugate-13 09/24/2014  . Pneumococcal Polysaccharide-23 08/15/2012  . Td 03/30/1996  . Tdap 12/19/2012  . Zoster 02/12/2014    Screening Tests Health Maintenance  Topic Date Due  . DEXA SCAN  12/27/2004  . COLONOSCOPY  05/24/2015  . INFLUENZA VACCINE  08/31/2015  . TETANUS/TDAP  12/20/2022  . ZOSTAVAX  Completed  . PNA vac Low Risk Adult  Completed    All answers were reviewed with the patient and necessary referrals were made:  SHAW,EVA, MD   01/05/2016   History reviewed: allergies, current medications, past family history, past medical history, past social history and problem list  Review of Systems Pertinent items are noted in HPI.    Objective:  BP 140/76 (BP Location: Left Arm, Patient Position: Sitting, Cuff Size: Normal)   Pulse 76   Temp 97.5 F (36.4 C) (Oral)   Resp 16   Ht 5' 4.5" (1.638 m)   Wt 139 lb 12.8 oz (63.4 kg)   SpO2 98%   BMI 23.63 kg/m    Visual Acuity Screening   Right eye Left eye Both eyes  Without correction:     With correction: '20/40 20/40 20/40 '$    BP 140/76 (BP Location: Left Arm, Patient Position: Sitting, Cuff Size: Normal)   Pulse 76   Temp 97.5 F (36.4 C) (Oral)   Resp 16   Ht 5' 4.5" (1.638 m)   Wt 139 lb 12.8 oz (63.4 kg)   SpO2 98%   BMI 23.63 kg/m   General Appearance:    Alert, cooperative, no distress, appears stated age  Head:    Normocephalic, without obvious abnormality, atraumatic  Eyes:     PERRL, conjunctiva/corneas clear, EOM's intact, both eyes  Ears:    Normal TM's and external ear canals, both ears  Nose:   Nares normal, septum midline, mucosa normal, no drainage    or sinus tenderness  Throat:   Lips, mucosa, and tongue normal; teeth and gums normal  Neck:   Supple, symmetrical, trachea midline, no adenopathy;    thyroid:  no enlargement/tenderness/nodules; no carotid   bruit or JVD  Back:     Symmetric, no curvature, ROM normal, no CVA tenderness  Lungs:     Clear to auscultation bilaterally, respirations unlabored  Chest Wall:    No tenderness or deformity   Heart:    Regular rate and rhythm, S1 and S2 normal, no murmur, rub   or gallop  Breast Exam:    No tenderness, masses, or nipple abnormality  Abdomen:     Soft, non-tender, bowel sounds active all four quadrants,    no masses, no organomegaly        Extremities:   Extremities normal, atraumatic, no cyanosis or edema  Pulses:   2+ and symmetric all extremities  Skin:   Skin color, texture, turgor normal, no rashes or lesions  Lymph nodes:   Cervical, supraclavicular, and axillary nodes normal  Neurologic:   CNII-XII intact, normal strength, sensation and reflexes    throughout        Assessment:     1. Medicare annual wellness visit, subsequent   2. Screening for cardiovascular, respiratory, and genitourinary diseases   3. Encounter for screening mammogram for breast cancer   4. Screening for colorectal cancer   5. Screening for deficiency anemia   6. Screening for thyroid disorder   7. Essential hypertension   8. Mild persistent asthma without complication   9. High cholesterol   10. Prediabetes   11. Need for prophylactic vaccination and inoculation against influenza   12. Estrogen deficiency         Plan:  Ua, lipid, cmp, cbc, tsh, hgba1c Flu shot  - yes Dexa - yes Mammogram - yes  Colonoscopy - yes   During the course of the visit the patient was educated and counseled about appropriate  screening and preventive services including:    Refer for dexa  Breast cancer screen: last mammogram 2005 - referred  CRS: colonoscopy done 04/2012 and increased to repeat in 3 yrs. Did she have it done - she says they will call her when it is time, she is going to keep doing this but has not heard from them this year - she thinks it is next year by Dr Benson Norway at [facility]  Immunizations: tdap 2014, zostavax 2016, pneumovax 2014, prevnar 2016  EKG: 07/2014  Diet review for nutrition referral? declined   Patient Instructions (the written plan) was given to the patient.  Medicare Attestation I have personally reviewed: The patient's medical and social history Their use of alcohol, tobacco or illicit drugs Their current medications and supplements The patient's functional ability including ADLs,fall risks, home safety risks, cognitive, and hearing and visual impairment Diet and physical activities Evidence for depression or mood disorders  The patient's weight, height, BMI, and visual acuity have been recorded in the chart.  I have made referrals, counseling, and provided education to the patient based on review of the above and I have provided the patient with a written personalized care plan for preventive services.     Delman Cheadle, MD   01/05/2016     Orders Placed This Encounter  Procedures  . MM Digital Screening    Standing Status:   Future    Standing Expiration Date:   03/08/2017    Scheduling Instructions:     She would like to have this scheduled on any Thursday, same appt as the dexa scan.    Order Specific Question:   Reason for Exam (SYMPTOM  OR DIAGNOSIS REQUIRED)    Answer:   screening for breast cancer    Order Specific Question:   Preferred imaging location?    Answer:   [facility]  . DG Bone Density    Standing Status:   Future    Standing Expiration Date:   03/08/2017    Scheduling Instructions:     Schedule at any Thursday at same time at her mammogram    Order  Specific Question:   Reason for Exam (SYMPTOM  OR DIAGNOSIS REQUIRED)    Answer:   screening for osteoporosis, post-menopausal estrogen deficiency    Order Specific Question:   Preferred imaging location?    Answer:   [facility]  . Flu Vaccine QUAD 36+ mos IM  . Lipid panel    Order Specific Question:   Has the patient fasted?    Answer:   Yes  . Comprehensive metabolic panel    Order Specific Question:   Has the patient fasted?    Answer:   Yes  . TSH  . CBC  . Hemoglobin A1c  . POCT urinalysis dipstick    Meds ordered this encounter  Medications  . ipratropium (ATROVENT) 0.03 % nasal spray    Sig: Place 2 sprays into the nose 4 (four) times daily.    Dispense:  30 mL    Refill:  11  . oxybutynin (DITROPAN) 5 MG tablet    Sig: Take 1 tablet (5 mg total) by mouth at bedtime.    Dispense:  30 tablet    Refill:  11  . carbamide peroxide (DEBROX) 6.5 % otic solution    Sig: Place 5 drops into the right ear at bedtime. For 5 days.  Use again prior to next visit in 6 mos    Dispense:  15 mL    Refill:  4    Results for orders placed or performed in visit on 01/06/16  Lipid panel  Result Value Ref Range   Cholesterol, Total 140 100 - 199 mg/dL   Triglycerides 50 0 - 149 mg/dL   HDL 53 >39 mg/dL   VLDL Cholesterol Cal 10 5 - 40 mg/dL   LDL Calculated 77 0 - 99 mg/dL   Chol/HDL Ratio 2.6 0.0 - 4.4 ratio units  Comprehensive metabolic panel  Result Value Ref Range   Glucose 98 65 - 99 mg/dL   BUN 22 8 - 27 mg/dL   Creatinine, Ser 1.00 0.57 - 1.00 mg/dL   GFR calc non Af Amer 55 (L) >59 mL/min/1.73   GFR calc Af Amer 63 >59 mL/min/1.73   BUN/Creatinine Ratio 22 12 - 28   Sodium 143 134 - 144 mmol/L   Potassium 5.0 3.5 - 5.2 mmol/L   Chloride 105 96 - 106 mmol/L   CO2 23 18 - 29 mmol/L   Calcium 9.2 8.7 - 10.3 mg/dL   Total Protein 6.8 6.0 - 8.5 g/dL   Albumin 4.0 3.5 - 4.8 g/dL   Globulin, Total 2.8 1.5 - 4.5 g/dL   Albumin/Globulin Ratio 1.4 1.2 - 2.2    Bilirubin Total 0.3 0.0 - 1.2 mg/dL   Alkaline Phosphatase 85 39 - 117 IU/L   AST 19 0 - 40 IU/L   ALT 11 0 - 32 IU/L  TSH  Result Value Ref Range   TSH 3.420 0.450 - 4.500 uIU/mL  CBC  Result Value Ref Range   WBC 5.5 3.4 - 10.8 x10E3/uL   RBC 3.71 (L) 3.77 - 5.28 x10E6/uL   Hemoglobin 11.7 11.1 - 15.9 g/dL   Hematocrit 37.2 34.0 - 46.6 %   MCV 100 (H) 79 - 97 fL   MCH 31.5 26.6 - 33.0 pg   MCHC 31.5 31.5 - 35.7 g/dL   RDW 14.0 12.3 - 15.4 %   Platelets 298 150 - 379 x10E3/uL  Hemoglobin A1c  Result Value Ref Range   Hgb A1c MFr Bld 5.4 4.8 - 5.6 %   Est. average glucose Bld gHb Est-mCnc 108 mg/dL  POCT urinalysis dipstick  Result Value Ref Range   Color, UA yellow yellow   Clarity, UA clear clear   Glucose, UA negative negative   Bilirubin, UA negative negative   Ketones, POC UA negative negative   Spec Grav, UA 1.015    Blood, UA negative negative   pH, UA 5.5    Protein Ur, POC negative negative   Urobilinogen, UA 0.2    Nitrite, UA Negative Negative   Leukocytes, UA Trace (A) Negative     Delman Cheadle, M.D.  [facility] [address] [place], [place] [id number] [phone] phone [phone] fax  02/04/16 12:01 PM

## 2016-01-06 ENCOUNTER — Ambulatory Visit (INDEPENDENT_AMBULATORY_CARE_PROVIDER_SITE_OTHER): Payer: Medicare Other | Admitting: Family Medicine

## 2016-01-06 ENCOUNTER — Encounter: Payer: Self-pay | Admitting: Family Medicine

## 2016-01-06 VITALS — BP 140/76 | HR 76 | Temp 97.5°F | Resp 16 | Ht 64.5 in | Wt 139.8 lb

## 2016-01-06 DIAGNOSIS — R7303 Prediabetes: Secondary | ICD-10-CM | POA: Diagnosis not present

## 2016-01-06 DIAGNOSIS — Z1383 Encounter for screening for respiratory disorder NEC: Secondary | ICD-10-CM | POA: Diagnosis not present

## 2016-01-06 DIAGNOSIS — Z1211 Encounter for screening for malignant neoplasm of colon: Secondary | ICD-10-CM

## 2016-01-06 DIAGNOSIS — Z1389 Encounter for screening for other disorder: Secondary | ICD-10-CM | POA: Diagnosis not present

## 2016-01-06 DIAGNOSIS — E7800 Pure hypercholesterolemia, unspecified: Secondary | ICD-10-CM | POA: Diagnosis not present

## 2016-01-06 DIAGNOSIS — J4530 Mild persistent asthma, uncomplicated: Secondary | ICD-10-CM

## 2016-01-06 DIAGNOSIS — Z136 Encounter for screening for cardiovascular disorders: Secondary | ICD-10-CM | POA: Diagnosis not present

## 2016-01-06 DIAGNOSIS — Z1212 Encounter for screening for malignant neoplasm of rectum: Secondary | ICD-10-CM

## 2016-01-06 DIAGNOSIS — Z130 Encounter for screening for diseases of the blood and blood-forming organs and certain disorders involving the immune mechanism: Secondary | ICD-10-CM | POA: Diagnosis not present

## 2016-01-06 DIAGNOSIS — E2839 Other primary ovarian failure: Secondary | ICD-10-CM | POA: Diagnosis not present

## 2016-01-06 DIAGNOSIS — Z1329 Encounter for screening for other suspected endocrine disorder: Secondary | ICD-10-CM

## 2016-01-06 DIAGNOSIS — I10 Essential (primary) hypertension: Secondary | ICD-10-CM | POA: Diagnosis not present

## 2016-01-06 DIAGNOSIS — Z23 Encounter for immunization: Secondary | ICD-10-CM | POA: Diagnosis not present

## 2016-01-06 DIAGNOSIS — Z1231 Encounter for screening mammogram for malignant neoplasm of breast: Secondary | ICD-10-CM

## 2016-01-06 DIAGNOSIS — Z0000 Encounter for general adult medical examination without abnormal findings: Secondary | ICD-10-CM | POA: Diagnosis not present

## 2016-01-06 LAB — POCT URINALYSIS DIP (MANUAL ENTRY)
BILIRUBIN UA: NEGATIVE
Bilirubin, UA: NEGATIVE
Blood, UA: NEGATIVE
Glucose, UA: NEGATIVE
Nitrite, UA: NEGATIVE
PROTEIN UA: NEGATIVE
SPEC GRAV UA: 1.015
Urobilinogen, UA: 0.2
pH, UA: 5.5

## 2016-01-06 MED ORDER — CARBAMIDE PEROXIDE 6.5 % OT SOLN: 5.0000 [drp] | Freq: Every day | OTIC | 4 refills | Status: DC

## 2016-01-06 MED ORDER — IPRATROPIUM BROMIDE 0.03 % NA SOLN: 2.0000 | Freq: Four times a day (QID) | NASAL | 11 refills | Status: DC

## 2016-01-06 MED ORDER — OXYBUTYNIN CHLORIDE 5 MG PO TABS: 5.0000 mg | ORAL_TABLET | Freq: Every day | ORAL | 11 refills | Status: DC

## 2016-01-06 NOTE — Patient Instructions (Addendum)
   IF you received an x-ray today, you will receive an invoice from [facility]. Please contact [facility] at [phone] with questions or concerns regarding your invoice.   IF you received labwork today, you will receive an invoice from [facility]. Please contact [facility] at [phone] with questions or concerns regarding your invoice.   Our billing staff will not be able to assist you with questions regarding bills from these companies.  You will be contacted with the lab results as soon as they are available. The fastest way to get your results is to activate your My Chart account. Instructions are located on the last page of this paperwork. If you have not heard from us regarding the results in 2 weeks, please contact this office.     Health Maintenance, Female Introduction Adopting a healthy lifestyle and getting preventive care can go a long way to promote health and wellness. Talk with your health care provider about what schedule of regular examinations is right for you. This is a good chance for you to check in with your provider about disease prevention and staying healthy. In between checkups, there are plenty of things you can do on your own. Experts have done a lot of research about which lifestyle changes and preventive measures are most likely to keep you healthy. Ask your health care provider for more information. Weight and diet Eat a healthy diet  Be sure to include plenty of vegetables, fruits, low-fat dairy products, and lean protein.  Do not eat a lot of foods high in solid fats, added sugars, or salt.  Get regular exercise. This is one of the most important things you can do for your health.  Most adults should exercise for at least 150 minutes each week. The exercise should increase your heart rate and make you sweat (moderate-intensity exercise).  Most adults should also do strengthening exercises at least twice a  week. This is in addition to the moderate-intensity exercise. Maintain a healthy weight  Body mass index (BMI) is a measurement that can be used to identify possible weight problems. It estimates body fat based on height and weight. Your health care provider can help determine your BMI and help you achieve or maintain a healthy weight.  For females 20 years of age and older:  A BMI below 18.5 is considered underweight.  A BMI of 18.5 to 24.9 is normal.  A BMI of 25 to 29.9 is considered overweight.  A BMI of 30 and above is considered obese. Watch levels of cholesterol and blood lipids  You should start having your blood tested for lipids and cholesterol at 76 years of age, then have this test every 5 years.  You may need to have your cholesterol levels checked more often if:  Your lipid or cholesterol levels are high.  You are older than 76 years of age.  You are at high risk for heart disease. Cancer screening Lung Cancer  Lung cancer screening is recommended for adults 55-80 years old who are at high risk for lung cancer because of a history of smoking.  A yearly low-dose CT scan of the lungs is recommended for people who:  Currently smoke.  Have quit within the past 15 years.  Have at least a 30-pack-year history of smoking. A pack year is smoking an average of one pack of cigarettes a day for 1 year.  Yearly screening should continue until it has been 15 years since you quit.    Yearly screening should stop if you develop a health problem that would prevent you from having lung cancer treatment. Breast Cancer  Practice breast self-awareness. This means understanding how your breasts normally appear and feel.  It also means doing regular breast self-exams. Let your health care provider know about any changes, no matter how small.  If you are in your 20s or 30s, you should have a clinical breast exam (CBE) by a health care provider every 1-3 years as part of a regular  health exam.  If you are 40 or older, have a CBE every year. Also consider having a breast X-ray (mammogram) every year.  If you have a family history of breast cancer, talk to your health care provider about genetic screening.  If you are at high risk for breast cancer, talk to your health care provider about having an MRI and a mammogram every year.  Breast cancer gene (BRCA) assessment is recommended for women who have family members with BRCA-related cancers. BRCA-related cancers include:  Breast.  Ovarian.  Tubal.  Peritoneal cancers.  Results of the assessment will determine the need for genetic counseling and BRCA1 and BRCA2 testing. Cervical Cancer  Your health care provider may recommend that you be screened regularly for cancer of the pelvic organs (ovaries, uterus, and vagina). This screening involves a pelvic examination, including checking for microscopic changes to the surface of your cervix (Pap test). You may be encouraged to have this screening done every 3 years, beginning at age 21.  For women ages 30-65, health care providers may recommend pelvic exams and Pap testing every 3 years, or they may recommend the Pap and pelvic exam, combined with testing for human papilloma virus (HPV), every 5 years. Some types of HPV increase your risk of cervical cancer. Testing for HPV may also be done on women of any age with unclear Pap test results.  Other health care providers may not recommend any screening for nonpregnant women who are considered low risk for pelvic cancer and who do not have symptoms. Ask your health care provider if a screening pelvic exam is right for you.  If you have had past treatment for cervical cancer or a condition that could lead to cancer, you need Pap tests and screening for cancer for at least 20 years after your treatment. If Pap tests have been discontinued, your risk factors (such as having a new sexual partner) need to be reassessed to determine  if screening should resume. Some women have medical problems that increase the chance of getting cervical cancer. In these cases, your health care provider may recommend more frequent screening and Pap tests. Colorectal Cancer  This type of cancer can be detected and often prevented.  Routine colorectal cancer screening usually begins at 76 years of age and continues through 75 years of age.  Your health care provider may recommend screening at an earlier age if you have risk factors for colon cancer.  Your health care provider may also recommend using home test kits to check for hidden blood in the stool.  A small camera at the end of a tube can be used to examine your colon directly (sigmoidoscopy or colonoscopy). This is done to check for the earliest forms of colorectal cancer.  Routine screening usually begins at age 50.  Direct examination of the colon should be repeated every 5-10 years through 75 years of age. However, you may need to be screened more often if early forms of precancerous polyps or   small growths are found. Skin Cancer  Check your skin from head to toe regularly.  Tell your health care provider about any new moles or changes in moles, especially if there is a change in a mole's shape or color.  Also tell your health care provider if you have a mole that is larger than the size of a pencil eraser.  Always use sunscreen. Apply sunscreen liberally and repeatedly throughout the day.  Protect yourself by wearing long sleeves, pants, a wide-brimmed hat, and sunglasses whenever you are outside. Heart disease, diabetes, and high blood pressure  High blood pressure causes heart disease and increases the risk of stroke. High blood pressure is more likely to develop in:  People who have blood pressure in the high end of the normal range (130-139/85-89 mm Hg).  People who are overweight or obese.  People who are African American.  If you are 18-39 years of age, have  your blood pressure checked every 3-5 years. If you are 40 years of age or older, have your blood pressure checked every year. You should have your blood pressure measured twice-once when you are at a hospital or clinic, and once when you are not at a hospital or clinic. Record the average of the two measurements. To check your blood pressure when you are not at a hospital or clinic, you can use:  An automated blood pressure machine at a pharmacy.  A home blood pressure monitor.  If you are between 55 years and 79 years old, ask your health care provider if you should take aspirin to prevent strokes.  Have regular diabetes screenings. This involves taking a blood sample to check your fasting blood sugar level.  If you are at a normal weight and have a low risk for diabetes, have this test once every three years after 76 years of age.  If you are overweight and have a high risk for diabetes, consider being tested at a younger age or more often. Preventing infection Hepatitis B  If you have a higher risk for hepatitis B, you should be screened for this virus. You are considered at high risk for hepatitis B if:  You were born in a country where hepatitis B is common. Ask your health care provider which countries are considered high risk.  Your parents were born in a high-risk country, and you have not been immunized against hepatitis B (hepatitis B vaccine).  You have HIV or AIDS.  You use needles to inject street drugs.  You live with someone who has hepatitis B.  You have had sex with someone who has hepatitis B.  You get hemodialysis treatment.  You take certain medicines for conditions, including cancer, organ transplantation, and autoimmune conditions. Hepatitis C  Blood testing is recommended for:  Everyone born from 1945 through 1965.  Anyone with known risk factors for hepatitis C. Sexually transmitted infections (STIs)  You should be screened for sexually transmitted  infections (STIs) including gonorrhea and chlamydia if:  You are sexually active and are younger than 76 years of age.  You are older than 76 years of age and your health care provider tells you that you are at risk for this type of infection.  Your sexual activity has changed since you were last screened and you are at an increased risk for chlamydia or gonorrhea. Ask your health care provider if you are at risk.  If you do not have HIV, but are at risk, it may be recommended that   prescription medicine daily to prevent HIV infection. This is called pre-exposure prophylaxis (PrEP). You are considered at risk if:  You are sexually active and do not regularly use condoms or know the HIV status of your partner(s).  You take drugs by injection.  You are sexually active with a partner who has HIV. Talk with your health care provider about whether you are at high risk of being infected with HIV. If you choose to begin PrEP, you should first be tested for HIV. You should then be tested every 3 months for as long as you are taking PrEP. Pregnancy  If you are premenopausal and you may become pregnant, ask your health care provider about preconception counseling.  If you may become pregnant, take 400 to 800 micrograms (mcg) of folic acid every day.  If you want to prevent pregnancy, talk to your health care provider about birth control (contraception). Osteoporosis and menopause  Osteoporosis is a disease in which the bones lose minerals and strength with aging. This can result in serious bone fractures. Your risk for osteoporosis can be identified using a bone density scan.  If you are 65 years of age or older, or if you are at risk for osteoporosis and fractures, ask your health care provider if you should be screened.  Ask your health care provider whether you should take a calcium or vitamin D supplement to lower your risk for osteoporosis.  Menopause may have certain physical symptoms and risks.  Hormone  replacement therapy may reduce some of these symptoms and risks. Talk to your health care provider about whether hormone replacement therapy is right for you. Follow these instructions at home:  Schedule regular health, dental, and eye exams.  Stay current with your immunizations.  Do not use any tobacco products including cigarettes, chewing tobacco, or electronic cigarettes.  If you are pregnant, do not drink alcohol.  If you are breastfeeding, limit how much and how often you drink alcohol.  Limit alcohol intake to no more than 1 drink per day for nonpregnant women. One drink equals 12 ounces of beer, 5 ounces of wine, or 1 ounces of hard liquor.  Do not use street drugs.  Do not share needles.  Ask your health care provider for help if you need support or information about quitting drugs.  Tell your health care provider if you often feel depressed.  Tell your health care provider if you have ever been abused or do not feel safe at home. This information is not intended to replace advice given to you by your health care provider. Make sure you discuss any questions you have with your health care provider. Document Released: 08/01/2010 Document Revised: 06/24/2015 Document Reviewed: 10/20/2014  2017 [employer]  

## 2016-01-08 LAB — COMPREHENSIVE METABOLIC PANEL
A/G RATIO: 1.4 (ref 1.2–2.2)
ALT: 11 IU/L (ref 0–32)
AST: 19 IU/L (ref 0–40)
Albumin: 4 g/dL (ref 3.5–4.8)
Alkaline Phosphatase: 85 IU/L (ref 39–117)
BILIRUBIN TOTAL: 0.3 mg/dL (ref 0.0–1.2)
BUN/Creatinine Ratio: 22 (ref 12–28)
BUN: 22 mg/dL (ref 8–27)
CO2: 23 mmol/L (ref 18–29)
Calcium: 9.2 mg/dL (ref 8.7–10.3)
Chloride: 105 mmol/L (ref 96–106)
Creatinine, Ser: 1 mg/dL (ref 0.57–1.00)
GFR calc non Af Amer: 55 mL/min/{1.73_m2} — ABNORMAL LOW (ref >59)
GFR, EST AFRICAN AMERICAN: 63 mL/min/{1.73_m2} (ref >59)
Globulin, Total: 2.8 g/dL (ref 1.5–4.5)
Glucose: 98 mg/dL (ref 65–99)
Potassium: 5 mmol/L (ref 3.5–5.2)
Sodium: 143 mmol/L (ref 134–144)
TOTAL PROTEIN: 6.8 g/dL (ref 6.0–8.5)

## 2016-01-08 LAB — CBC
Hematocrit: 37.2 % (ref 34.0–46.6)
Hemoglobin: 11.7 g/dL (ref 11.1–15.9)
MCH: 31.5 pg (ref 26.6–33.0)
MCHC: 31.5 g/dL (ref 31.5–35.7)
MCV: 100 fL — ABNORMAL HIGH (ref 79–97)
Platelets: 298 10*3/uL (ref 150–379)
RBC: 3.71 x10E6/uL — ABNORMAL LOW (ref 3.77–5.28)
RDW: 14 % (ref 12.3–15.4)
WBC: 5.5 10*3/uL (ref 3.4–10.8)

## 2016-01-08 LAB — LIPID PANEL
Chol/HDL Ratio: 2.6 ratio units (ref 0.0–4.4)
Cholesterol, Total: 140 mg/dL (ref 100–199)
HDL: 53 mg/dL (ref >39)
LDL CALC: 77 mg/dL (ref 0–99)
TRIGLYCERIDES: 50 mg/dL (ref 0–149)
VLDL Cholesterol Cal: 10 mg/dL (ref 5–40)

## 2016-01-08 LAB — TSH: TSH: 3.42 u[IU]/mL (ref 0.450–4.500)

## 2016-01-08 LAB — HEMOGLOBIN A1C
ESTIMATED AVERAGE GLUCOSE: 108 mg/dL
Hgb A1c MFr Bld: 5.4 % (ref 4.8–5.6)

## 2016-03-01 ENCOUNTER — Other Ambulatory Visit: Payer: Self-pay | Admitting: Family Medicine

## 2016-03-01 DIAGNOSIS — R059 Cough, unspecified: Secondary | ICD-10-CM

## 2016-03-01 DIAGNOSIS — R05 Cough: Secondary | ICD-10-CM

## 2016-03-09 DIAGNOSIS — H52223 Regular astigmatism, bilateral: Secondary | ICD-10-CM | POA: Diagnosis not present

## 2016-03-09 DIAGNOSIS — H524 Presbyopia: Secondary | ICD-10-CM | POA: Diagnosis not present

## 2016-03-09 DIAGNOSIS — H35363 Drusen (degenerative) of macula, bilateral: Secondary | ICD-10-CM | POA: Diagnosis not present

## 2016-03-09 DIAGNOSIS — H2513 Age-related nuclear cataract, bilateral: Secondary | ICD-10-CM | POA: Diagnosis not present

## 2016-03-09 DIAGNOSIS — H5203 Hypermetropia, bilateral: Secondary | ICD-10-CM | POA: Diagnosis not present

## 2016-05-04 ENCOUNTER — Ambulatory Visit
Admission: RE | Admit: 2016-05-04 | Discharge: 2016-05-04 | Disposition: A | Discharge status: Home / Self Care | Payer: Medicare Other | Source: Ambulatory Visit | Attending: Family Medicine | Admitting: Family Medicine

## 2016-05-04 DIAGNOSIS — M8589 Other specified disorders of bone density and structure, multiple sites: Secondary | ICD-10-CM | POA: Diagnosis not present

## 2016-05-04 DIAGNOSIS — E2839 Other primary ovarian failure: Secondary | ICD-10-CM

## 2016-05-04 DIAGNOSIS — Z1231 Encounter for screening mammogram for malignant neoplasm of breast: Secondary | ICD-10-CM | POA: Diagnosis not present

## 2016-05-04 DIAGNOSIS — Z780 Asymptomatic menopausal state: Secondary | ICD-10-CM | POA: Diagnosis not present

## 2016-05-04 DIAGNOSIS — M8580 Other specified disorders of bone density and structure, unspecified site: Secondary | ICD-10-CM

## 2016-05-04 HISTORY — DX: Other specified disorders of bone density and structure, unspecified site: M85.80

## 2016-05-09 DIAGNOSIS — H25013 Cortical age-related cataract, bilateral: Secondary | ICD-10-CM | POA: Diagnosis not present

## 2016-05-09 DIAGNOSIS — I10 Essential (primary) hypertension: Secondary | ICD-10-CM | POA: Diagnosis not present

## 2016-05-09 DIAGNOSIS — H2511 Age-related nuclear cataract, right eye: Secondary | ICD-10-CM | POA: Diagnosis not present

## 2016-05-09 DIAGNOSIS — H25043 Posterior subcapsular polar age-related cataract, bilateral: Secondary | ICD-10-CM | POA: Diagnosis not present

## 2016-05-09 DIAGNOSIS — H2513 Age-related nuclear cataract, bilateral: Secondary | ICD-10-CM | POA: Diagnosis not present

## 2016-06-19 ENCOUNTER — Other Ambulatory Visit: Payer: Self-pay | Admitting: Family Medicine

## 2016-06-19 DIAGNOSIS — E7800 Pure hypercholesterolemia, unspecified: Secondary | ICD-10-CM

## 2016-06-19 DIAGNOSIS — I10 Essential (primary) hypertension: Secondary | ICD-10-CM

## 2016-06-20 NOTE — Telephone Encounter (Signed)
Pt has appt scheduled with me in 2 wks - 07/06/16 for recheck HTN so will refill meds 3 months now. If she keeps that appointment, then okay to refill meds at 6 months next request.

## 2016-06-26 DIAGNOSIS — H2511 Age-related nuclear cataract, right eye: Secondary | ICD-10-CM | POA: Diagnosis not present

## 2016-06-26 DIAGNOSIS — H2512 Age-related nuclear cataract, left eye: Secondary | ICD-10-CM | POA: Diagnosis not present

## 2016-06-27 DIAGNOSIS — H2512 Age-related nuclear cataract, left eye: Secondary | ICD-10-CM | POA: Diagnosis not present

## 2016-07-06 ENCOUNTER — Ambulatory Visit (INDEPENDENT_AMBULATORY_CARE_PROVIDER_SITE_OTHER): Payer: Medicare Other | Admitting: Family Medicine

## 2016-07-06 ENCOUNTER — Encounter: Payer: Self-pay | Admitting: Family Medicine

## 2016-07-06 DIAGNOSIS — E7800 Pure hypercholesterolemia, unspecified: Secondary | ICD-10-CM | POA: Diagnosis not present

## 2016-07-06 DIAGNOSIS — I10 Essential (primary) hypertension: Secondary | ICD-10-CM

## 2016-07-06 MED ORDER — LISINOPRIL-HYDROCHLOROTHIAZIDE 20-12.5 MG PO TABS: 2.0000 | ORAL_TABLET | Freq: Every day | ORAL | 1 refills | Status: DC

## 2016-07-06 MED ORDER — SIMVASTATIN 20 MG PO TABS: 20.0000 mg | ORAL_TABLET | Freq: Every evening | ORAL | 1 refills | Status: DC

## 2016-07-06 MED ORDER — AMLODIPINE BESYLATE 5 MG PO TABS: 5.0000 mg | ORAL_TABLET | Freq: Every day | ORAL | 1 refills | Status: DC

## 2016-07-06 NOTE — Patient Instructions (Addendum)
   IF you received an x-ray today, you will receive an invoice from [facility]. Please contact [facility] at [phone] with questions or concerns regarding your invoice.   IF you received labwork today, you will receive an invoice from [facility]. Please contact [facility] at [phone] with questions or concerns regarding your invoice.   Our billing staff will not be able to assist you with questions regarding bills from these companies.  You will be contacted with the lab results as soon as they are available. The fastest way to get your results is to activate your My Chart account. Instructions are located on the last page of this paperwork. If you have not heard from us regarding the results in 2 weeks, please contact this office.      Managing Your Hypertension Hypertension is commonly called high blood pressure. This is when the force of your blood pressing against the walls of your arteries is too strong. Arteries are blood vessels that carry blood from your heart throughout your body. Hypertension forces the heart to work harder to pump blood, and may cause the arteries to become narrow or stiff. Having untreated or uncontrolled hypertension can cause heart attack, stroke, kidney disease, and other problems. What are blood pressure readings? A blood pressure reading consists of a higher number over a lower number. Ideally, your blood pressure should be below 120/80. The first ("top") number is called the systolic pressure. It is a measure of the pressure in your arteries as your heart beats. The second ("bottom") number is called the diastolic pressure. It is a measure of the pressure in your arteries as the heart relaxes. What does my blood pressure reading mean? Blood pressure is classified into four stages. Based on your blood pressure reading, your health care provider may use the following stages to determine what type of treatment you need, if any. Systolic  pressure and diastolic pressure are measured in a unit called mm Hg. Normal  Systolic pressure: below 120.  Diastolic pressure: below 80. Elevated  Systolic pressure: 120-129.  Diastolic pressure: below 80. Hypertension stage 1  Systolic pressure: 130-139.  Diastolic pressure: 80-89. Hypertension stage 2  Systolic pressure: 140 or above.  Diastolic pressure: 90 or above. What health risks are associated with hypertension? Managing your hypertension is an important responsibility. Uncontrolled hypertension can lead to:  A heart attack.  A stroke.  A weakened blood vessel (aneurysm).  Heart failure.  Kidney damage.  Eye damage.  Metabolic syndrome.  Memory and concentration problems.  What changes can I make to manage my hypertension? Hypertension can be managed by making lifestyle changes and possibly by taking medicines. Your health care provider will help you make a plan to bring your blood pressure within a normal range. Eating and drinking  Eat a diet that is high in fiber and potassium, and low in salt (sodium), added sugar, and fat. An example eating plan is called the DASH (Dietary Approaches to Stop Hypertension) diet. To eat this way: ? Eat plenty of fresh fruits and vegetables. Try to fill half of your plate at each meal with fruits and vegetables. ? Eat whole grains, such as whole wheat pasta, brown rice, or whole grain bread. Fill about one quarter of your plate with whole grains. ? Eat low-fat diary products. ? Avoid fatty cuts of meat, processed or cured meats, and poultry with skin. Fill about one quarter of your plate with lean proteins such as fish, chicken without skin, beans, eggs,   and tofu. ? Avoid premade and processed foods. These tend to be higher in sodium, added sugar, and fat.  Reduce your daily sodium intake. Most people with hypertension should eat less than 1,500 mg of sodium a day.  Limit alcohol intake to no more than 1 drink a day  for nonpregnant women and 2 drinks a day for men. One drink equals 12 oz of beer, 5 oz of wine, or 1 oz of hard liquor. Lifestyle  Work with your health care provider to maintain a healthy body weight, or to lose weight. Ask what an ideal weight is for you.  Get at least 30 minutes of exercise that causes your heart to beat faster (aerobic exercise) most days of the week. Activities may include walking, swimming, or biking.  Include exercise to strengthen your muscles (resistance exercise), such as weight lifting, as part of your weekly exercise routine. Try to do these types of exercises for 30 minutes at least 3 days a week.  Do not use any products that contain nicotine or tobacco, such as cigarettes and e-cigarettes. If you need help quitting, ask your health care provider.  Control any long-term (chronic) conditions you have, such as high cholesterol or diabetes. Monitoring  Monitor your blood pressure at home as told by your health care provider. Your personal target blood pressure may vary depending on your medical conditions, your age, and other factors.  Have your blood pressure checked regularly, as often as told by your health care provider. Working with your health care provider  Review all the medicines you take with your health care provider because there may be side effects or interactions.  Talk with your health care provider about your diet, exercise habits, and other lifestyle factors that may be contributing to hypertension.  Visit your health care provider regularly. Your health care provider can help you create and adjust your plan for managing hypertension. Will I need medicine to control my blood pressure? Your health care provider may prescribe medicine if lifestyle changes are not enough to get your blood pressure under control, and if:  Your systolic blood pressure is 130 or higher.  Your diastolic blood pressure is 80 or higher.  Take medicines only as told  by your health care provider. Follow the directions carefully. Blood pressure medicines must be taken as prescribed. The medicine does not work as well when you skip doses. Skipping doses also puts you at risk for problems. Contact a health care provider if:  You think you are having a reaction to medicines you have taken.  You have repeated (recurrent) headaches.  You feel dizzy.  You have swelling in your ankles.  You have trouble with your vision. Get help right away if:  You develop a severe headache or confusion.  You have unusual weakness or numbness, or you feel faint.  You have severe pain in your chest or abdomen.  You vomit repeatedly.  You have trouble breathing. Summary  Hypertension is when the force of blood pumping through your arteries is too strong. If this condition is not controlled, it may put you at risk for serious complications.  Your personal target blood pressure may vary depending on your medical conditions, your age, and other factors. For most people, a normal blood pressure is less than 120/80.  Hypertension is managed by lifestyle changes, medicines, or both. Lifestyle changes include weight loss, eating a healthy, low-sodium diet, exercising more, and limiting alcohol. This information is not intended to replace advice   given to you by your health care provider. Make sure you discuss any questions you have with your health care provider. Document Released: 10/11/2011 Document Revised: 12/15/2015 Document Reviewed: 12/15/2015 [employer] Interactive Patient Education  2018 [employer]  

## 2016-07-06 NOTE — Progress Notes (Signed)
Subjective:    Patient ID: Kathryn Gomez, female    DOB: Apr 18, 1940, 76 y.o.   MRN: [id number] Chief Complaint  Patient presents with  . Hypertension  . Follow-up    HPI  [name] is a delightful 76 yo woman who is here for a 6 month follow-up on her HTN.  Past Medical History:  Diagnosis Date  . Allergy   . Arthritis   . Asthma   . Cataract    bilateral  . Hypercholesteremia   . Hypertension   . Pneumonia    hx of   Past Surgical History:  Procedure Laterality Date  . ABDOMINAL HYSTERECTOMY  Feb 1976   Pelvic pain (no cancer)  . FRACTURE SURGERY Right 20 years ago   two fingers  . TOTAL HIP ARTHROPLASTY Left 09/03/2012   Procedure: LEFT TOTAL HIP ARTHROPLASTY ANTERIOR APPROACH;  Surgeon: Shelda Pal, MD;  Location: [facility];  Service: Orthopedics;  Laterality: Left;   Current Outpatient Prescriptions on File Prior to Visit  Medication Sig Dispense Refill  . albuterol (PROVENTIL HFA;VENTOLIN HFA) 108 (90 Base) MCG/ACT inhaler Inhale 2 puffs into the lungs every 6 (six) hours as needed for wheezing or shortness of breath. 1 Inhaler 12  . aspirin 81 MG tablet Take 81 mg by mouth daily.    . carbamide peroxide (DEBROX) 6.5 % otic solution Place 5 drops into the right ear at bedtime. For 5 days. Use again prior to next visit in 6 mos 15 mL 4  . cyclobenzaprine (FLEXERIL) 5 MG tablet Take 1 tablet (5 mg total) by mouth at bedtime. 30 tablet 0  . ipratropium (ATROVENT) 0.03 % nasal spray Place 2 sprays into the nose 4 (four) times daily. 30 mL 11  . loratadine (CLARITIN) 10 MG tablet Take 10 mg by mouth daily.    Kathryn Kitchen OVER THE COUNTER MEDICATION OTC PreserVision eye vitamin and mineral supplement taking one in the am and one at night    . oxybutynin (DITROPAN) 5 MG tablet Take 1 tablet (5 mg total) by mouth at bedtime. 30 tablet 11  . Probiotic Product ([employer] PROBIOTIC MAXIMUM STRENGTH) CAPS Take 1 capsule by mouth daily. 60 capsule 5   No current facility-administered  medications on file prior to visit.    No Known Allergies Family History  Problem Relation Age of Onset  . Hypertension Mother   . Hypertension Maternal Grandmother   . Hypertension Brother   . Obesity Brother    Social History   Social History  . Marital status: Widowed    Spouse name: n/a  . Number of children: 3  . Years of education: 11th grade   Occupational History  . line server [employer]   Social History Main Topics  . Smoking status: Never Smoker  . Smokeless tobacco: Never Used  . Alcohol use No  . Drug use: No  . Sexual activity: Not Asked   Other Topics Concern  . None   Social History Narrative   Does not exercise.    Grandson lives with her.   Her great grandsons stay with her on the weekends.   Has a very strong Faith.   Depression screen [facility] 2/9 07/06/2016 01/06/2016 10/05/2015 07/01/2015 04/22/2015  Decreased Interest 0 0 0 0 0  Down, Depressed, Hopeless 0 0 0 0 0  PHQ - 2 Score 0 0 0 0 0     Review of Systems  Constitutional: Negative for activity change, appetite change,  chills, diaphoresis and fever.  Eyes: Negative for visual disturbance.  Respiratory: Negative for cough and shortness of breath.   Cardiovascular: Negative for chest pain, palpitations and leg swelling.  Genitourinary: Negative for decreased urine volume.  Neurological: Negative for syncope and headaches.  Hematological: Does not bruise/bleed easily.       Objective:   Physical Exam  Constitutional: She is oriented to person, place, and time. She appears well-developed and well-nourished. No distress.  HENT:  Head: Normocephalic and atraumatic.  Right Ear: External ear normal.  Left Ear: External ear normal.  Eyes: Conjunctivae are normal. No scleral icterus.  Neck: Normal range of motion. Neck supple. No thyromegaly present.  Cardiovascular: Normal rate, regular rhythm, normal heart sounds and intact distal pulses.   Pulmonary/Chest: Effort normal and breath  sounds normal. No respiratory distress.  Musculoskeletal: She exhibits no edema.  Lymphadenopathy:    She has no cervical adenopathy.  Neurological: She is alert and oriented to person, place, and time.  Skin: Skin is warm and dry. She is not diaphoretic. No erythema.  Psychiatric: She has a normal mood and affect. Her behavior is normal.      BP 130/73   Pulse 69   Temp 97.8 F (36.6 C) (Oral)   Resp 18   Ht 5' 4.5" (1.638 m)   Wt 143 lb 6.4 oz (65 kg)   SpO2 100%   BMI 24.23 kg/m      Assessment & Plan:  Provided 3 months of refills on her chronic medications of amlodipine, lisinopril HCTZ, and simvastatin just 2 weeks prior. Okay to refill meds for 6 additional months at her next request. Patient has her annual wellness visit with me and he scheduled for 6 months from now.  Mammogram normal 05/04/16. Her older daughter is diagnosed with breast cancer. Drinks milk regularly.   Occ checks Bp outside of office.   Has had surgery on the rt, needs on the Lt, needs cataracts claritin daily   1. High cholesterol   2. Essential hypertension     Meds ordered this encounter  Medications  . simvastatin (ZOCOR) 20 MG tablet    Sig: Take 1 tablet (20 mg total) by mouth every evening.    Dispense:  90 tablet    Refill:  1  . lisinopril-hydrochlorothiazide (PRINZIDE,ZESTORETIC) 20-12.5 MG tablet    Sig: Take 2 tablets by mouth daily.    Dispense:  180 tablet    Refill:  1  . amLODipine (NORVASC) 5 MG tablet    Sig: Take 1 tablet (5 mg total) by mouth daily.    Dispense:  90 tablet    Refill:  1      Norberto SorensonEva Kyleeann Cremeans, M.D.  Primary [facility] at [facility]  [place] [address] [place], [place] [address] [phone] phone [phone] fax  07/08/16 9:52 PM

## 2016-07-17 DIAGNOSIS — H2512 Age-related nuclear cataract, left eye: Secondary | ICD-10-CM | POA: Diagnosis not present

## 2017-01-11 ENCOUNTER — Encounter: Payer: Self-pay | Admitting: Family Medicine

## 2017-01-11 ENCOUNTER — Ambulatory Visit (INDEPENDENT_AMBULATORY_CARE_PROVIDER_SITE_OTHER): Payer: Medicare Other | Admitting: Family Medicine

## 2017-01-11 ENCOUNTER — Other Ambulatory Visit: Payer: Self-pay

## 2017-01-11 VITALS — BP 128/72 | HR 88 | Temp 98.0°F | Resp 16 | Ht 64.5 in | Wt 143.2 lb

## 2017-01-11 DIAGNOSIS — E782 Mixed hyperlipidemia: Secondary | ICD-10-CM

## 2017-01-11 DIAGNOSIS — Z23 Encounter for immunization: Secondary | ICD-10-CM | POA: Diagnosis not present

## 2017-01-11 DIAGNOSIS — L853 Xerosis cutis: Secondary | ICD-10-CM

## 2017-01-11 DIAGNOSIS — J4530 Mild persistent asthma, uncomplicated: Secondary | ICD-10-CM

## 2017-01-11 DIAGNOSIS — Z0000 Encounter for general adult medical examination without abnormal findings: Secondary | ICD-10-CM

## 2017-01-11 DIAGNOSIS — R05 Cough: Secondary | ICD-10-CM

## 2017-01-11 DIAGNOSIS — I10 Essential (primary) hypertension: Secondary | ICD-10-CM | POA: Diagnosis not present

## 2017-01-11 DIAGNOSIS — R058 Other specified cough: Secondary | ICD-10-CM

## 2017-01-11 DIAGNOSIS — Z87898 Personal history of other specified conditions: Secondary | ICD-10-CM | POA: Diagnosis not present

## 2017-01-11 DIAGNOSIS — R7303 Prediabetes: Secondary | ICD-10-CM

## 2017-01-11 DIAGNOSIS — K591 Functional diarrhea: Secondary | ICD-10-CM

## 2017-01-11 DIAGNOSIS — R351 Nocturia: Secondary | ICD-10-CM | POA: Diagnosis not present

## 2017-01-11 DIAGNOSIS — E7800 Pure hypercholesterolemia, unspecified: Secondary | ICD-10-CM

## 2017-01-11 LAB — POCT URINALYSIS DIP (MANUAL ENTRY)
BILIRUBIN UA: NEGATIVE
Glucose, UA: NEGATIVE mg/dL
Ketones, POC UA: NEGATIVE mg/dL
NITRITE UA: NEGATIVE
PH UA: 5.5 (ref 5.0–8.0)
Protein Ur, POC: NEGATIVE mg/dL
Spec Grav, UA: 1.025 (ref 1.010–1.025)
UROBILINOGEN UA: 0.2 U/dL

## 2017-01-11 MED ORDER — SIMVASTATIN 20 MG PO TABS: 20.0000 mg | ORAL_TABLET | Freq: Every evening | ORAL | 3 refills | Status: DC

## 2017-01-11 MED ORDER — OXYBUTYNIN CHLORIDE 5 MG PO TABS: 5.0000 mg | ORAL_TABLET | Freq: Every day | ORAL | 3 refills | Status: DC

## 2017-01-11 MED ORDER — ALBUTEROL SULFATE HFA 108 (90 BASE) MCG/ACT IN AERS: 2.0000 | INHALATION_SPRAY | Freq: Four times a day (QID) | RESPIRATORY_TRACT | 12 refills | Status: DC | PRN

## 2017-01-11 MED ORDER — LISINOPRIL-HYDROCHLOROTHIAZIDE 20-12.5 MG PO TABS: 2.0000 | ORAL_TABLET | Freq: Every day | ORAL | 3 refills | Status: DC

## 2017-01-11 MED ORDER — AMLODIPINE BESYLATE 5 MG PO TABS: 5.0000 mg | ORAL_TABLET | Freq: Every day | ORAL | 3 refills | Status: DC

## 2017-01-11 MED ORDER — AMMONIUM LACTATE 12 % EX LOTN: 1.0000 "application " | TOPICAL_LOTION | CUTANEOUS | 2 refills | Status: DC | PRN

## 2017-01-11 NOTE — Progress Notes (Addendum)
Subjective:    Patient ID: Kathryn Gomez, female    DOB: [birth year], 76 y.o.   MRN: [id number]  HPI Primary Preventative Screenings: Cervical Cancer: No need due to age. STI screening: Declines, not sexually active in many years Breast Cancer: 4/5/2018[facility] at breast center normal, repeat 1 year Colorectal Cancer:  Dr. Elnoria Howard - pt was seen 01/20/2015 and he stated that in 2014 her colonoscopy was negative for any true polyps - pt does not remember being recalled last year for this though our records state she is on a 3 year recall.  Actual colonoscopy report from Dr. Elnoria Howard received and will be scanned in - initial notes on colonoscopy show repeat in 3-5 years as 2 2-72mm polyps in ascending colon, diverticulosis in sigmoid and proximal transverse, and medium grade external and internal hemorrhoids but pathology shows polyps are mucosal prolapse-type so Dr. Elnoria Howard noted NO FURTHER COLONOSCOPIES on 04/03/2012. Tobacco use/EtOH/substances: Bone Density: DEXA bone scan 05/04/2016 OSTEOPENIA -- T-score -1.2 at femur. 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 OTC/Vit/Supp/Herbal: asa 81, probiotics, eye mvi, claritin prn Dentist/Optho: Dr. Vonna Kotyk regularly - cataracts. Immunizations:  Immunization History  Administered Date(s) Administered  . Influenza Split 11/29/2011  . Influenza,inj,Quad PF,6+ Mos 11/14/2012, 11/27/2013, 12/29/2014, 01/06/2016  . Pneumococcal Conjugate-13 09/24/2014  . Pneumococcal Polysaccharide-23 08/15/2012  . Td 03/30/1996  . Tdap 12/19/2012  . Zoster 02/12/2014     Chronic Medical  Conditions: HTN: not checking ouside office. No probl w/ med - on lisinopril-hctz 40-25 and amlodipine 5. Excellent compliance. Takes all in the a.m. HLD: tol simvastatin well - takes every afternoon. has been well controlled on simvastatin 20 - LDL has been about 70. Asthma: only needs to use albuterol when ill - will start wheezing and chest tightness, then needs twice a day. Having a nocturnal cough - no chest tightness, resolves w/ a sip of water. Typically only occurs in the winter. Not bad enough to want to do anything about it. Agrees to flu shot today. Notes she has had a dry rough rash around her left lower abdomen for about a week.  Still working as a Designer, fashion/clothing at [employer] - no plans to retire - feels like it keeps her young. Has a supportive family around her - enjoys teasing her granddaughters.   Past Medical History:  Diagnosis Date  . Allergy   . Arthritis   . Asthma   . Cataract    bilateral  . Hypercholesteremia   . Hypertension   . Pneumonia    hx of   Past Surgical History:  Procedure Laterality Date  . ABDOMINAL HYSTERECTOMY  Feb 1976   Pelvic pain (no cancer)  . FRACTURE SURGERY Right 20 years ago   two fingers  . TOTAL HIP ARTHROPLASTY Left 09/03/2012   Procedure: LEFT TOTAL HIP ARTHROPLASTY ANTERIOR APPROACH;  Surgeon: Shelda Pal, MD;  Location: [facility];  Service: Orthopedics;  Laterality: Left;   Current Outpatient Medications on File Prior to Visit  Medication Sig Dispense Refill  . albuterol (PROVENTIL HFA;VENTOLIN HFA) 108 (90 Base) MCG/ACT inhaler Inhale  2 puffs into the lungs every 6 (six) hours as needed for wheezing or shortness of breath. 1 Inhaler 12  . amLODipine (NORVASC) 5 MG tablet Take 1 tablet (5 mg total) by mouth daily. 90 tablet 1  . aspirin 81 MG tablet Take 81 mg by mouth daily.    . carbamide peroxide (DEBROX) 6.5 % otic solution Place 5 drops into the right ear at bedtime. For 5 days. Use again prior to next visit in 6 mos 15 mL 4  .  cyclobenzaprine (FLEXERIL) 5 MG tablet Take 1 tablet (5 mg total) by mouth at bedtime. 30 tablet 0  . ipratropium (ATROVENT) 0.03 % nasal spray Place 2 sprays into the nose 4 (four) times daily. 30 mL 11  . lisinopril-hydrochlorothiazide (PRINZIDE,ZESTORETIC) 20-12.5 MG tablet Take 2 tablets by mouth daily. 180 tablet 1  . loratadine (CLARITIN) 10 MG tablet Take 10 mg by mouth daily.    Kathryn Kitchen OVER THE COUNTER MEDICATION OTC PreserVision eye vitamin and mineral supplement taking one in the am and one at night    . oxybutynin (DITROPAN) 5 MG tablet Take 1 tablet (5 mg total) by mouth at bedtime. 30 tablet 11  . Probiotic Product (CVS PROBIOTIC MAXIMUM STRENGTH) CAPS Take 1 capsule by mouth daily. 60 capsule 5  . simvastatin (ZOCOR) 20 MG tablet Take 1 tablet (20 mg total) by mouth every evening. 90 tablet 1   No current facility-administered medications on file prior to visit.    No Known Allergies Family History  Problem Relation Age of Onset  . Hypertension Mother   . Hypertension Maternal Grandmother   . Hypertension Brother   . Obesity Brother    Social History   Socioeconomic History  . Marital status: Widowed    Spouse name: n/a  . Number of children: 3  . Years of education: 11th grade  . Highest education level: None  Social Needs  . Financial resource strain: None  . Food insecurity - worry: None  . Food insecurity - inability: None  . Transportation needs - medical: None  . Transportation needs - non-medical: None  Occupational History  . Occupation: Data processing manager: [employer]  Tobacco Use  . Smoking status: Never Smoker  . Smokeless tobacco: Never Used  Substance and Sexual Activity  . Alcohol use: No  . Drug use: No  . Sexual activity: None  Other Topics Concern  . None  Social History Narrative   Does not exercise.    Grandson lives with her.   Her great grandsons stay with her on the weekends.   Has a very strong Faith.   Depression  screen [facility] 2/9 01/11/2017 07/06/2016 01/06/2016 10/05/2015 07/01/2015  Decreased Interest 0 0 0 0 0  Down, Depressed, Hopeless 0 0 0 0 0  PHQ - 2 Score 0 0 0 0 0    Review of Systems  Respiratory: Positive for cough.   Skin: Positive for rash.  All other systems reviewed and are negative. urinary frequency at night     Objective:   Physical Exam  Constitutional: She is oriented to person, place, and time. She appears well-developed and well-nourished. No distress.  HENT:  Head: Normocephalic and atraumatic.  Right Ear: Tympanic membrane, external ear and ear canal normal.  Left Ear: Tympanic membrane, external ear and ear canal normal.  Nose: Nose normal. No mucosal edema or rhinorrhea.  Mouth/Throat: Uvula is midline, oropharynx is clear and moist and mucous membranes are  normal. No posterior oropharyngeal erythema.  Eyes: Conjunctivae and EOM are normal. Pupils are equal, round, and reactive to light. Right eye exhibits no discharge. Left eye exhibits no discharge. No scleral icterus.  Neck: Normal range of motion. Neck supple. No thyromegaly present.  Cardiovascular: Normal rate, regular rhythm, normal heart sounds and intact distal pulses.  Pulmonary/Chest: Effort normal and breath sounds normal. No respiratory distress.  Abdominal: Soft. Bowel sounds are normal. There is no tenderness.  Genitourinary: No breast swelling, tenderness or bleeding.  Musculoskeletal: She exhibits no edema.  Lymphadenopathy:    She has no cervical adenopathy.    She has no axillary adenopathy.       Right: No supraclavicular adenopathy present.       Left: No supraclavicular adenopathy present.  Neurological: She is alert and oriented to person, place, and time. She has normal reflexes.  Skin: Skin is warm and dry. She is not diaphoretic. No erythema.  Psychiatric: She has a normal mood and affect. Her behavior is normal.    Visual Acuity Screening   Right eye Left eye Both eyes  Without correction:      With correction: 20/25 20/30 20/20        BP 128/72   Pulse 88   Temp 98 F (36.7 C)   Resp 16   Ht 5' 4.5" (1.638 m)   Wt 143 lb 3.2 oz (65 kg)   SpO2 100%   BMI 24.20 kg/m   Results for orders placed or performed in visit on 01/11/17  POCT urinalysis dipstick  Result Value Ref Range   Color, UA yellow yellow   Clarity, UA clear clear   Glucose, UA negative negative mg/dL   Bilirubin, UA negative negative   Ketones, POC UA negative negative mg/dL   Spec Grav, UA 0.4541.025 0.9811.010 - 1.025   Blood, UA trace-intact (A) negative   pH, UA 5.5 5.0 - 8.0   Protein Ur, POC negative negative mg/dL   Urobilinogen, UA 0.2 0.2 or 1.0 E.U./dL   Nitrite, UA Negative Negative   Leukocytes, UA Small (1+) (A) Negative     Assessment & Plan:  No further colonoscopies needed per Dr. Elnoria HowardHung.  1. Annual physical exam   2. Need for prophylactic vaccination and inoculation against influenza   3. Essential hypertension - well controlled, cont amlodipine 5 and lisinopril-hctz 40-25. Recheck in 1 yr  4. Mild persistent asthma without complication -- rare prn albuterol  5. Dry skin dermatitis - try hydrating lotion like amonium lactate or OTC (see avs)  6. Nocturnal cough - could be lisinopril or asthma but pt declines any new inhaler or change in BP meds since self-resolved w/ water and season change  7. Mixed hyperlipidemia - well controlled on statin prior, recheck and refills  8. High cholesterol   9. History of prediabetes - resolved last yr, weight stable, recheck  10. Nocturia - cont oxybutynin qhs, no daytimes sxs, not impairing her, check sugar and urine     Orders Placed This Encounter  Procedures  . Flu Vaccine QUAD 6+ mos PF IM (Fluarix Quad PF)  . Comprehensive metabolic panel    Order Specific Question:   Has the patient fasted?    Answer:   Yes  . Lipid panel    Order Specific Question:   Has the patient fasted?    Answer:   Yes  . CBC with Differential/Platelet  . Hemoglobin  A1c  . POCT urinalysis dipstick    Meds ordered this  encounter  Medications  . ammonium lactate (LAC-HYDRIN) 12 % lotion    Sig: Apply 1 application topically as needed for dry skin.    Dispense:  500 g    Refill:  2  . albuterol (PROVENTIL HFA;VENTOLIN HFA) 108 (90 Base) MCG/ACT inhaler    Sig: Inhale 2 puffs into the lungs every 6 (six) hours as needed for wheezing or shortness of breath.    Dispense:  1 Inhaler    Refill:  12  . amLODipine (NORVASC) 5 MG tablet    Sig: Take 1 tablet (5 mg total) by mouth daily.    Dispense:  90 tablet    Refill:  3  . lisinopril-hydrochlorothiazide (PRINZIDE,ZESTORETIC) 20-12.5 MG tablet    Sig: Take 2 tablets by mouth daily.    Dispense:  180 tablet    Refill:  3  . oxybutynin (DITROPAN) 5 MG tablet    Sig: Take 1 tablet (5 mg total) by mouth at bedtime.    Dispense:  90 tablet    Refill:  3  . simvastatin (ZOCOR) 20 MG tablet    Sig: Take 1 tablet (20 mg total) by mouth every evening.    Dispense:  90 tablet    Refill:  3    Norberto SorensonEva Shaw, M.D.  [facility] at [facility]  [place] [address] [place], [place] [address] [phone] phone [phone] fax  01/11/17 9:40 AM

## 2017-01-11 NOTE — Patient Instructions (Addendum)
Please make an appointment at the scheduling desk with Dewitt Hoes, our Nurse Health and Wellness Advisor, for your Wellness Visit done yearly at [address] appointment clinic, which is a FREE annual benefit with your insurance.    Use the prescribed ammonium lactate or good hypoallergenic thick moisturizer cream such as Eucerin, Cedaphil, or Aquaphor and apply this continually once to twice a day - especially immediately after showering. Purchase something that is thick - in a tub that you have to scoop is best.  If the nighttime cough is bothering you, we could try starting a night allergy med or try changing your blood pressure medicine  IF you received an x-ray today, you will receive an invoice from [facility]. Please contact [facility] at [phone] with questions or concerns regarding your invoice.   IF you received labwork today, you will receive an invoice from [facility]. Please contact [facility] at [phone] with questions or concerns regarding your invoice.   Our billing staff will not be able to assist you with questions regarding bills from these companies.  You will be contacted with the lab results as soon as they are available. The fastest way to get your results is to activate your My Chart account. Instructions are located on the last page of this paperwork. If you have not heard from [place] regarding the results in 2 weeks, please contact this office.      Health Maintenance for Postmenopausal Women Menopause is a normal process in which your reproductive ability comes to an end. This process happens gradually over a span of months to years, usually between the ages of 47 and 13. Menopause is complete when you have missed 12 consecutive menstrual periods. It is important to talk with your health care provider about some of the most common conditions that affect postmenopausal women, such as heart disease, cancer, and bone loss (osteoporosis). Adopting a  healthy lifestyle and getting preventive care can help to promote your health and wellness. Those actions can also lower your chances of developing some of these common conditions. What should I know about menopause? During menopause, you may experience a number of symptoms, such as:  Moderate-to-severe hot flashes.  Night sweats.  Decrease in sex drive.  Mood swings.  Headaches.  Tiredness.  Irritability.  Memory problems.  Insomnia.  Choosing to treat or not to treat menopausal changes is an individual decision that you make with your health care provider. What should I know about hormone replacement therapy and supplements? Hormone therapy products are effective for treating symptoms that are associated with menopause, such as hot flashes and night sweats. Hormone replacement carries certain risks, especially as you become older. If you are thinking about using estrogen or estrogen with progestin treatments, discuss the benefits and risks with your health care provider. What should I know about heart disease and stroke? Heart disease, heart attack, and stroke become more likely as you age. This may be due, in part, to the hormonal changes that your body experiences during menopause. These can affect how your body processes dietary fats, triglycerides, and cholesterol. Heart attack and stroke are both medical emergencies. There are many things that you can do to help prevent heart disease and stroke:  Have your blood pressure checked at least every 1-2 years. High blood pressure causes heart disease and increases the risk of stroke.  If you are 12-72 years old, ask your health care provider if you should take aspirin to prevent a heart attack or a  stroke.  Do not use any tobacco products, including cigarettes, chewing tobacco, or electronic cigarettes. If you need help quitting, ask your health care provider.  It is important to eat a healthy diet and maintain a healthy  weight. ? Be sure to include plenty of vegetables, fruits, low-fat dairy products, and lean protein. ? Avoid eating foods that are high in solid fats, added sugars, or salt (sodium).  Get regular exercise. This is one of the most important things that you can do for your health. ? Try to exercise for at least 150 minutes each week. The type of exercise that you do should increase your heart rate and make you sweat. This is known as moderate-intensity exercise. ? Try to do strengthening exercises at least twice each week. Do these in addition to the moderate-intensity exercise.  Know your numbers.Ask your health care provider to check your cholesterol and your blood glucose. Continue to have your blood tested as directed by your health care provider.  What should I know about cancer screening? There are several types of cancer. Take the following steps to reduce your risk and to catch any cancer development as early as possible. Breast Cancer  Practice breast self-awareness. ? This means understanding how your breasts normally appear and feel. ? It also means doing regular breast self-exams. Let your health care provider know about any changes, no matter how small.  If you are 3 or older, have a clinician do a breast exam (clinical breast exam or CBE) every year. Depending on your age, family history, and medical history, it may be recommended that you also have a yearly breast X-ray (mammogram).  If you have a family history of breast cancer, talk with your health care provider about genetic screening.  If you are at high risk for breast cancer, talk with your health care provider about having an MRI and a mammogram every year.  Breast cancer (BRCA) gene test is recommended for women who have family members with BRCA-related cancers. Results of the assessment will determine the need for genetic counseling and BRCA1 and for BRCA2 testing. BRCA-related cancers include these types: ? Breast.  This occurs in males or females. ? Ovarian. ? Tubal. This may also be called fallopian tube cancer. ? Cancer of the abdominal or pelvic lining (peritoneal cancer). ? Prostate. ? Pancreatic.  Cervical, Uterine, and Ovarian Cancer Your health care provider may recommend that you be screened regularly for cancer of the pelvic organs. These include your ovaries, uterus, and vagina. This screening involves a pelvic exam, which includes checking for microscopic changes to the surface of your cervix (Pap test).  For women ages 21-65, health care providers may recommend a pelvic exam and a Pap test every three years. For women ages 66-65, they may recommend the Pap test and pelvic exam, combined with testing for human papilloma virus (HPV), every five years. Some types of HPV increase your risk of cervical cancer. Testing for HPV may also be done on women of any age who have unclear Pap test results.  Other health care providers may not recommend any screening for nonpregnant women who are considered low risk for pelvic cancer and have no symptoms. Ask your health care provider if a screening pelvic exam is right for you.  If you have had past treatment for cervical cancer or a condition that could lead to cancer, you need Pap tests and screening for cancer for at least 20 years after your treatment. If Pap tests have  been discontinued for you, your risk factors (such as having a new sexual partner) need to be reassessed to determine if you should start having screenings again. Some women have medical problems that increase the chance of getting cervical cancer. In these cases, your health care provider may recommend that you have screening and Pap tests more often.  If you have a family history of uterine cancer or ovarian cancer, talk with your health care provider about genetic screening.  If you have vaginal bleeding after reaching menopause, tell your health care provider.  There are currently no  reliable tests available to screen for ovarian cancer.  Lung Cancer Lung cancer screening is recommended for adults 10-94 years old who are at high risk for lung cancer because of a history of smoking. A yearly low-dose CT scan of the lungs is recommended if you:  Currently smoke.  Have a history of at least 30 pack-years of smoking and you currently smoke or have quit within the past 15 years. A pack-year is smoking an average of one pack of cigarettes per day for one year.  Yearly screening should:  Continue until it has been 15 years since you quit.  Stop if you develop a health problem that would prevent you from having lung cancer treatment.  Colorectal Cancer  This type of cancer can be detected and can often be prevented.  Routine colorectal cancer screening usually begins at age 78 and continues through age 32.  If you have risk factors for colon cancer, your health care provider may recommend that you be screened at an earlier age.  If you have a family history of colorectal cancer, talk with your health care provider about genetic screening.  Your health care provider may also recommend using home test kits to check for hidden blood in your stool.  A small camera at the end of a tube can be used to examine your colon directly (sigmoidoscopy or colonoscopy). This is done to check for the earliest forms of colorectal cancer.  Direct examination of the colon should be repeated every 5-10 years until age 45. However, if early forms of precancerous polyps or small growths are found or if you have a family history or genetic risk for colorectal cancer, you may need to be screened more often.  Skin Cancer  Check your skin from head to toe regularly.  Monitor any moles. Be sure to tell your health care provider: ? About any new moles or changes in moles, especially if there is a change in a mole's shape or color. ? If you have a mole that is larger than the size of a pencil  eraser.  If any of your family members has a history of skin cancer, especially at a young age, talk with your health care provider about genetic screening.  Always use sunscreen. Apply sunscreen liberally and repeatedly throughout the day.  Whenever you are outside, protect yourself by wearing long sleeves, pants, a wide-brimmed hat, and sunglasses.  What should I know about osteoporosis? Osteoporosis is a condition in which bone destruction happens more quickly than new bone creation. After menopause, you may be at an increased risk for osteoporosis. To help prevent osteoporosis or the bone fractures that can happen because of osteoporosis, the following is recommended:  If you are 45-66 years old, get at least 1,000 mg of calcium and at least 600 mg of vitamin D per day.  If you are older than age 53 but younger than age  70, get at least 1,200 mg of calcium and at least 600 mg of vitamin D per day.  If you are older than age 70, get at least 1,200 mg of calcium and at least 800 mg of vitamin D per day.  Smoking and excessive alcohol intake increase the risk of osteoporosis. Eat foods that are rich in calcium and vitamin D, and do weight-bearing exercises several times each week as directed by your health care provider. What should I know about how menopause affects my mental health? Depression may occur at any age, but it is more common as you become older. Common symptoms of depression include:  Low or sad mood.  Changes in sleep patterns.  Changes in appetite or eating patterns.  Feeling an overall lack of motivation or enjoyment of activities that you previously enjoyed.  Frequent crying spells.  Talk with your health care provider if you think that you are experiencing depression. What should I know about immunizations? It is important that you get and maintain your immunizations. These include:  Tetanus, diphtheria, and pertussis (Tdap) booster vaccine.  Influenza every  year before the flu season begins.  Pneumonia vaccine.  Shingles vaccine.  Your health care provider may also recommend other immunizations. This information is not intended to replace advice given to you by your health care provider. Make sure you discuss any questions you have with your health care provider. Document Released: 03/10/2005 Document Revised: 08/06/2015 Document Reviewed: 10/20/2014 Elsevier Interactive Patient Education  2018 [employer].  Calcium Intake Recommendations Calcium is a mineral that affects many functions in the body, including:  Blood clotting.  Blood vessel function.  Nerve impulse conduction.  Hormone secretion.  Muscle contraction.  Bone and teeth functions.  Most of your body's calcium supply is stored in your bones and teeth. When your calcium stores are low, you may be at risk for low bone mass, bone loss, and bone fractures. Consuming enough calcium helps to grow healthy bones and teeth and to prevent breakdown over time. It is very important that you get enough calcium if you are:  A child undergoing rapid growth.  An adolescent girl.  A pre- or post-menopausal woman.  A woman whose menstrual cycle has stopped due to anorexia nervosa or regular intense exercise.  An individual with lactose intolerance or a milk allergy.  A vegetarian.  What is my plan? Try to consume the recommended amount of calcium daily based on your age. Depending on your overall health, your health care provider may recommend increased calcium intake.General daily calcium intake recommendations by age are:  Birth to 6 months: 200 mg.  Infants 7 to 12 months: 260 mg.  Children 1 to 3 years: 700 mg.  Children 4 to 8 years: 1,000 mg.  Children 9 to 13 years: 1,300 mg.  Teens 14 to 18 years: 1,300 mg.  Adults 19 to 50 years: 1,000 mg.  Adult women 51 to 70 years: 1,200 mg.  Adult men 51 to 70 years: 1,000 mg.  Adults 71 years and older: 1,200  mg.  Pregnant and breastfeeding teens: 1,300 mg.  Pregnant and breastfeeding adults: 1,000 mg.  What do I need to know about calcium intake?  In order for the body to absorb calcium, it needs vitamin D. You can get vitamin D through: ? Direct exposure of the skin to sunlight. ? Foods, such as egg yolks, liver, saltwater fish, and fortified milk. ? Supplements.  Consuming too much calcium may cause: ? Constipation. ?  Decreased absorption of iron and zinc. ? Kidney stones.  Calcium supplements may interact with certain medicines. Check with your health care provider before starting any calcium supplements.  Try to get most of your calcium from food. What foods can I eat? Grains  Fortified oatmeal. Fortified ready-to-eat cereals. Fortified frozen waffles. Vegetables Turnip greens. Broccoli. Fruits Fortified orange juice. Meats and Other Protein Sources Canned sardines with bones. Canned salmon with bones. Soy beans. Tofu. Baked beans. Almonds. [place] nuts. Sunflower seeds. Dairy Milk. Yogurt. Cheese. Cottage cheese. Beverages Fortified soy milk. Fortified rice milk. Sweets/Desserts Pudding. Ice Cream. Milkshakes. Blackstrap molasses. The items listed above may not be a complete list of recommended foods or beverages. Contact your dietitian for more options. What foods can affect my calcium intake? It may be more difficult for your body to use calcium or calcium may leave your body more quickly if you consume large amounts of:  Sodium.  Protein.  Caffeine.  Alcohol.  This information is not intended to replace advice given to you by your health care provider. Make sure you discuss any questions you have with your health care provider. Document Released: 08/31/2003 Document Revised: 08/06/2015 Document Reviewed: 06/24/2013 Elsevier Interactive Patient Education  2018 [employer].

## 2017-01-12 LAB — COMPREHENSIVE METABOLIC PANEL
ALK PHOS: 91 IU/L (ref 39–117)
ALT: 11 IU/L (ref 0–32)
AST: 18 IU/L (ref 0–40)
Albumin/Globulin Ratio: 1.6 (ref 1.2–2.2)
Albumin: 4.4 g/dL (ref 3.5–4.8)
BUN/Creatinine Ratio: 18 (ref 12–28)
BUN: 19 mg/dL (ref 8–27)
Bilirubin Total: 0.3 mg/dL (ref 0.0–1.2)
CALCIUM: 9.5 mg/dL (ref 8.7–10.3)
CO2: 23 mmol/L (ref 20–29)
Chloride: 106 mmol/L (ref 96–106)
Creatinine, Ser: 1.05 mg/dL — ABNORMAL HIGH (ref 0.57–1.00)
GFR calc Af Amer: 59 mL/min/{1.73_m2} — ABNORMAL LOW (ref >59)
GFR calc non Af Amer: 51 mL/min/{1.73_m2} — ABNORMAL LOW (ref >59)
GLUCOSE: 94 mg/dL (ref 65–99)
Globulin, Total: 2.8 g/dL (ref 1.5–4.5)
Potassium: 4.5 mmol/L (ref 3.5–5.2)
Sodium: 140 mmol/L (ref 134–144)
Total Protein: 7.2 g/dL (ref 6.0–8.5)

## 2017-01-12 LAB — LIPID PANEL
CHOL/HDL RATIO: 3.2 ratio (ref 0.0–4.4)
CHOLESTEROL TOTAL: 151 mg/dL (ref 100–199)
HDL: 47 mg/dL (ref >39)
LDL CALC: 82 mg/dL (ref 0–99)
TRIGLYCERIDES: 110 mg/dL (ref 0–149)
VLDL Cholesterol Cal: 22 mg/dL (ref 5–40)

## 2017-01-12 LAB — CBC WITH DIFFERENTIAL/PLATELET
BASOS ABS: 0 10*3/uL (ref 0.0–0.2)
BASOS: 1 %
EOS (ABSOLUTE): 0.4 10*3/uL (ref 0.0–0.4)
Eos: 6 %
HEMOGLOBIN: 12.4 g/dL (ref 11.1–15.9)
Hematocrit: 37.4 % (ref 34.0–46.6)
Immature Grans (Abs): 0 10*3/uL (ref 0.0–0.1)
Immature Granulocytes: 0 %
LYMPHS ABS: 1.4 10*3/uL (ref 0.7–3.1)
LYMPHS: 21 %
MCH: 32.2 pg (ref 26.6–33.0)
MCHC: 33.2 g/dL (ref 31.5–35.7)
MCV: 97 fL (ref 79–97)
MONOCYTES: 8 %
Monocytes Absolute: 0.5 10*3/uL (ref 0.1–0.9)
NEUTROS ABS: 4.5 10*3/uL (ref 1.4–7.0)
Neutrophils: 64 %
Platelets: 308 10*3/uL (ref 150–379)
RBC: 3.85 x10E6/uL (ref 3.77–5.28)
RDW: 13.8 % (ref 12.3–15.4)
WBC: 6.9 10*3/uL (ref 3.4–10.8)

## 2017-01-12 LAB — HEMOGLOBIN A1C
ESTIMATED AVERAGE GLUCOSE: 120 mg/dL
Hgb A1c MFr Bld: 5.8 % — ABNORMAL HIGH (ref 4.8–5.6)

## 2017-01-18 ENCOUNTER — Ambulatory Visit (INDEPENDENT_AMBULATORY_CARE_PROVIDER_SITE_OTHER): Payer: Medicare Other

## 2017-01-18 VITALS — BP 128/80 | HR 79 | Ht 65.0 in | Wt 147.0 lb

## 2017-01-18 DIAGNOSIS — Z0000 Encounter for general adult medical examination without abnormal findings: Secondary | ICD-10-CM

## 2017-01-18 NOTE — Patient Instructions (Addendum)
[name] , Thank you for taking time to come for your Medicare Wellness Visit. I appreciate your ongoing commitment to your health goals. Please review the following plan we discussed and let me know if I can assist you in the future.   Screening recommendations/referrals: Colonoscopy: up to date, stops after 75 Mammogram: up to date, stops after 75 Bone Density: up to date, next due 05/04/2021 Recommended yearly ophthalmology/optometry visit for glaucoma screening and checkup Recommended yearly dental visit for hygiene and checkup  Vaccinations: Influenza vaccine: up to date Pneumococcal vaccine: up to date Tdap vaccine: up to date, next due 12/20/2022 Shingles vaccine: up to date, Check with pharmacy about receiving the Shingrix vaccine    Advanced directives: Advance directive discussed with you today. Even though you declined this today please call our office should you change your mind and we can give you the proper paperwork for you to fill out.   Conditions/risks identified: Try to start working less and relaxing more  Next appointment: schedule follow up visit with PCP, 1 year for AWV    Preventive Care 65 Years and Older, Female Preventive care refers to lifestyle choices and visits with your health care provider that can promote health and wellness. What does preventive care include?  A yearly physical exam. This is also called an annual well check.  Dental exams once or twice a year.  Routine eye exams. Ask your health care provider how often you should have your eyes checked.  Personal lifestyle choices, including:  Daily care of your teeth and gums.  Regular physical activity.  Eating a healthy diet.  Avoiding tobacco and drug use.  Limiting alcohol use.  Practicing safe sex.  Taking low-dose aspirin every day.  Taking vitamin and mineral supplements as recommended by your health care provider. What happens during an annual well check? The services and  screenings done by your health care provider during your annual well check will depend on your age, overall health, lifestyle risk factors, and family history of disease. Counseling  Your health care provider may ask you questions about your:  Alcohol use.  Tobacco use.  Drug use.  Emotional well-being.  Home and relationship well-being.  Sexual activity.  Eating habits.  History of falls.  Memory and ability to understand (cognition).  Work and work Astronomerenvironment.  Reproductive health. Screening  You may have the following tests or measurements:  Height, weight, and BMI.  Blood pressure.  Lipid and cholesterol levels. These may be checked every 5 years, or more frequently if you are over 77 years old.  Skin check.  Lung cancer screening. You may have this screening every year starting at age 77 if you have a 30-pack-year history of smoking and currently smoke or have quit within the past 15 years.  Fecal occult blood test (FOBT) of the stool. You may have this test every year starting at age 77.  Flexible sigmoidoscopy or colonoscopy. You may have a sigmoidoscopy every 5 years or a colonoscopy every 10 years starting at age 77.  Hepatitis C blood test.  Hepatitis B blood test.  Sexually transmitted disease (STD) testing.  Diabetes screening. This is done by checking your blood sugar (glucose) after you have not eaten for a while (fasting). You may have this done every 1-3 years.  Bone density scan. This is done to screen for osteoporosis. You may have this done starting at age 77.  Mammogram. This may be done every 1-2 years. Talk to your health  care provider about how often you should have regular mammograms. Talk with your health care provider about your test results, treatment options, and if necessary, the need for more tests. Vaccines  Your health care provider may recommend certain vaccines, such as:  Influenza vaccine. This is recommended every  year.  Tetanus, diphtheria, and acellular pertussis (Tdap, Td) vaccine. You may need a Td booster every 10 years.  Zoster vaccine. You may need this after age 53.  Pneumococcal 13-valent conjugate (PCV13) vaccine. One dose is recommended after age 56.  Pneumococcal polysaccharide (PPSV23) vaccine. One dose is recommended after age 2. Talk to your health care provider about which screenings and vaccines you need and how often you need them. This information is not intended to replace advice given to you by your health care provider. Make sure you discuss any questions you have with your health care provider. Document Released: 02/12/2015 Document Revised: 10/06/2015 Document Reviewed: 11/17/2014 Elsevier Interactive Patient Education  2017 [place] Prevention in the Home Falls can cause injuries. They can happen to people of all ages. There are many things you can do to make your home safe and to help prevent falls. What can I do on the outside of my home?  Regularly fix the edges of walkways and driveways and fix any cracks.  Remove anything that might make you trip as you walk through a door, such as a raised step or threshold.  Trim any bushes or trees on the path to your home.  Use bright outdoor lighting.  Clear any walking paths of anything that might make someone trip, such as rocks or tools.  Regularly check to see if handrails are loose or broken. Make sure that both sides of any steps have handrails.  Any raised decks and porches should have guardrails on the edges.  Have any leaves, snow, or ice cleared regularly.  Use sand or salt on walking paths during winter.  Clean up any spills in your garage right away. This includes oil or grease spills. What can I do in the bathroom?  Use night lights.  Install grab bars by the toilet and in the tub and shower. Do not use towel bars as grab bars.  Use non-skid mats or decals in the tub or shower.  If you  need to sit down in the shower, use a plastic, non-slip stool.  Keep the floor dry. Clean up any water that spills on the floor as soon as it happens.  Remove soap buildup in the tub or shower regularly.  Attach bath mats securely with double-sided non-slip rug tape.  Do not have throw rugs and other things on the floor that can make you trip. What can I do in the bedroom?  Use night lights.  Make sure that you have a light by your bed that is easy to reach.  Do not use any sheets or blankets that are too big for your bed. They should not hang down onto the floor.  Have a firm chair that has side arms. You can use this for support while you get dressed.  Do not have throw rugs and other things on the floor that can make you trip. What can I do in the kitchen?  Clean up any spills right away.  Avoid walking on wet floors.  Keep items that you use a lot in easy-to-reach places.  If you need to reach something above you, use a strong step stool that has a grab  bar.  Keep electrical cords out of the way.  Do not use floor polish or wax that makes floors slippery. If you must use wax, use non-skid floor wax.  Do not have throw rugs and other things on the floor that can make you trip. What can I do with my stairs?  Do not leave any items on the stairs.  Make sure that there are handrails on both sides of the stairs and use them. Fix handrails that are broken or loose. Make sure that handrails are as long as the stairways.  Check any carpeting to make sure that it is firmly attached to the stairs. Fix any carpet that is loose or worn.  Avoid having throw rugs at the top or bottom of the stairs. If you do have throw rugs, attach them to the floor with carpet tape.  Make sure that you have a light switch at the top of the stairs and the bottom of the stairs. If you do not have them, ask someone to add them for you. What else can I do to help prevent falls?  Wear shoes  that:  Do not have high heels.  Have rubber bottoms.  Are comfortable and fit you well.  Are closed at the toe. Do not wear sandals.  If you use a stepladder:  Make sure that it is fully opened. Do not climb a closed stepladder.  Make sure that both sides of the stepladder are locked into place.  Ask someone to hold it for you, if possible.  Clearly mark and make sure that you can see:  Any grab bars or handrails.  First and last steps.  Where the edge of each step is.  Use tools that help you move around (mobility aids) if they are needed. These include:  Canes.  Walkers.  Scooters.  Crutches.  Turn on the lights when you go into a dark area. Replace any light bulbs as soon as they burn out.  Set up your furniture so you have a clear path. Avoid moving your furniture around.  If any of your floors are uneven, fix them.  If there are any pets around you, be aware of where they are.  Review your medicines with your doctor. Some medicines can make you feel dizzy. This can increase your chance of falling. Ask your doctor what other things that you can do to help prevent falls. This information is not intended to replace advice given to you by your health care provider. Make sure you discuss any questions you have with your health care provider. Document Released: 11/12/2008 Document Revised: 06/24/2015 Document Reviewed: 02/20/2014 Elsevier Interactive Patient Education  2017 [employer].

## 2017-01-18 NOTE — Progress Notes (Signed)
Subjective:   Kathryn Gomez is a 76 y.o. female who presents for Medicare Annual (Subsequent) preventive examination.  Review of Systems:  N/A Cardiac Risk Factors include: advanced age (>32men, >71 women);hypertension;dyslipidemia     Objective:     Vitals: BP 128/80   Pulse 79   Ht 5\' 5"  (1.651 m)   Wt 147 lb (66.7 kg)   SpO2 100%   BMI 24.46 kg/m   Body mass index is 24.46 kg/m.  Advanced Directives 01/18/2017 08/13/2014 09/03/2012 08/29/2012  Does Patient Have a Medical Advance Directive? No No Patient has advance directive, copy not in chart Patient has advance directive, copy not in chart  Type of Advance Directive - - Living will Living will  Copy of Healthcare Power of Attorney in Chart? - - Copy requested from family Copy requested from family  Would patient like information on creating a medical advance directive? No - Patient declined No - patient declined information - -  Pre-existing out of facility DNR order (yellow form or pink MOST form) - - No No    Tobacco Social History   Tobacco Use  Smoking Status Never Smoker  Smokeless Tobacco Never Used     Counseling given: Not Answered   Clinical Intake:  Pre-visit preparation completed: Yes  Pain : No/denies pain     Nutritional Status: BMI of 19-24  Normal Nutritional Risks: None Diabetes: No  How often do you need to have someone help you when you read instructions, pamphlets, or other written materials from your doctor or pharmacy?: 1 - Never What is the last grade level you completed in school?: 11th grade  Interpreter Needed?: No  Information entered by :: Janalyn Shy, LPN  Past Medical History:  Diagnosis Date  . Allergy   . Arthritis   . Asthma   . Cataract    bilateral  . Hypercholesteremia   . Hypertension   . Osteopenia 05/04/2016   T scare -1.2 at femur  . Pneumonia    hx of   Past Surgical History:  Procedure Laterality Date  . ABDOMINAL HYSTERECTOMY  Feb 1976   Pelvic pain (no cancer)  . FRACTURE SURGERY Right 20 years ago   two fingers  . TOTAL HIP ARTHROPLASTY Left 09/03/2012   Procedure: LEFT TOTAL HIP ARTHROPLASTY ANTERIOR APPROACH;  Surgeon: Shelda Pal, MD;  Location: [facility];  Service: Orthopedics;  Laterality: Left;   Family History  Problem Relation Age of Onset  . Hypertension Mother   . Hypertension Maternal Grandmother   . Hypertension Brother   . Obesity Brother    Social History   Socioeconomic History  . Marital status: Widowed    Spouse name: n/a  . Number of children: 3  . Years of education: 11th grade  . Highest education level: 11th grade  Social Needs  . Financial resource strain: Not hard at all  . Food insecurity - worry: Never true  . Food insecurity - inability: Never true  . Transportation needs - medical: No  . Transportation needs - non-medical: No  Occupational History  . Occupation: Data processing manager: [employer]  Tobacco Use  . Smoking status: Never Smoker  . Smokeless tobacco: Never Used  Substance and Sexual Activity  . Alcohol use: No  . Drug use: No  . Sexual activity: None  Other Topics Concern  . None  Social History Narrative   Does not exercise.    Grandson lives with her.  Her great grandsons stay with her on the weekends.   Has a very strong Faith.    Outpatient Encounter Medications as of 01/18/2017  Medication Sig  . albuterol (PROVENTIL HFA;VENTOLIN HFA) 108 (90 Base) MCG/ACT inhaler Inhale 2 puffs into the lungs every 6 (six) hours as needed for wheezing or shortness of breath.  Marland Kitchen. amLODipine (NORVASC) 5 MG tablet Take 1 tablet (5 mg total) by mouth daily.  Marland Kitchen. ammonium lactate (LAC-HYDRIN) 12 % lotion Apply 1 application topically as needed for dry skin.  Marland Kitchen. aspirin 81 MG tablet Take 81 mg by mouth daily.  . carbamide peroxide (DEBROX) 6.5 % otic solution Place 5 drops into the right ear at bedtime. For 5 days. Use again prior to next visit in 6 mos  .  ipratropium (ATROVENT) 0.03 % nasal spray Place 2 sprays into the nose 4 (four) times daily.  Marland Kitchen. lisinopril-hydrochlorothiazide (PRINZIDE,ZESTORETIC) 20-12.5 MG tablet Take 2 tablets by mouth daily.  Marland Kitchen. loratadine (CLARITIN) 10 MG tablet Take 10 mg by mouth daily.  Marland Kitchen. OVER THE COUNTER MEDICATION OTC PreserVision eye vitamin and mineral supplement taking one in the am and one at night  . oxybutynin (DITROPAN) 5 MG tablet Take 1 tablet (5 mg total) by mouth at bedtime.  . Probiotic Product ([employer]S PROBIOTIC MAXIMUM STRENGTH) CAPS Take 1 capsule by mouth daily.  . simvastatin (ZOCOR) 20 MG tablet Take 1 tablet (20 mg total) by mouth every evening.   No facility-administered encounter medications on file as of 01/18/2017.     Activities of Daily Living In your present state of health, do you have any difficulty performing the following activities: 01/18/2017  Hearing? N  Vision? N  Difficulty concentrating or making decisions? N  Walking or climbing stairs? Y  Comment slight issues with climbing stairs  Dressing or bathing? N  Doing errands, shopping? N  Preparing Food and eating ? N  Using the Toilet? N  In the past six months, have you accidently leaked urine? N  Do you have problems with loss of bowel control? N  Managing your Medications? N  Managing your Finances? N  Housekeeping or managing your Housekeeping? N  Some recent data might be hidden    Patient Care Team: Sherren MochaShaw, Eva N, MD as PCP - General (Family Medicine)    Assessment:   This is a routine wellness examination for Kathryn Gomez.  Exercise Activities and Dietary recommendations Current Exercise Habits: The patient does not participate in regular exercise at present, Exercise limited by: None identified  Goals    . work less     Patient states that she would like to try to start working less and relaxing more       Fall Risk Fall Risk  01/18/2017 01/11/2017 07/06/2016 01/06/2016 10/05/2015  Falls in the past year? No No No No  No  Number falls in past yr: - - - - -   Is the patient's home free of loose throw rugs in walkways, pet beds, electrical cords, etc?   yes      Grab bars in the bathroom? yes      Handrails on the stairs?   no      Adequate lighting?   yes  Timed Get Up and Go performed: yes, passed, completed within 30 seconds  Depression Screen PHQ 2/9 Scores 01/18/2017 01/11/2017 07/06/2016 01/06/2016  PHQ - 2 Score 0 0 0 0     Cognitive Function     6CIT Screen 01/18/2017  What Year?  0 points  What month? 0 points  What time? 0 points  Count back from 20 0 points  Months in reverse 0 points  Repeat phrase 2 points  Total Score 2    Immunization History  Administered Date(s) Administered  . Influenza Split 11/29/2011  . Influenza,inj,Quad PF,6+ Mos 11/14/2012, 11/27/2013, 12/29/2014, 01/06/2016, 01/11/2017  . Pneumococcal Conjugate-13 09/24/2014  . Pneumococcal Polysaccharide-23 08/15/2012  . Td 03/30/1996  . Tdap 12/19/2012  . Zoster 02/12/2014    Qualifies for Shingles Vaccine?Zostavax completed 02/12/2014, advised patient to check at pharmacy about receiving Shingrix vaccine  Screening Tests Health Maintenance  Topic Date Due  . TETANUS/TDAP  12/20/2022  . INFLUENZA VACCINE  Completed  . DEXA SCAN  Completed  . PNA vac Low Risk Adult  Completed    Cancer Screenings: Lung: Low Dose CT Chest recommended if Age 41-80 years, 30 pack-year currently smoking OR have quit w/in 15years. Patient does not qualify. Breast:  Up to date on Mammogram? Yes, completed 05/04/2016  Up to date of Bone Density/Dexa? Yes, completed 05/04/2016 Colorectal: Colonoscopy completed 05/23/2012  Additional Screenings:  Hepatitis B/HIV/Syphillis: not indicated Hepatitis C Screening: not indicated      Plan:   I have personally reviewed and noted the following in the patient's chart:   . Medical and social history . Use of alcohol, tobacco or illicit drugs  . Current medications and  supplements . Functional ability and status . Nutritional status . Physical activity . Advanced directives . List of other physicians . Hospitalizations, surgeries, and ER visits in previous 12 months . Vitals . Screenings to include cognitive, depression, and falls . Referrals and appointments  In addition, I have reviewed and discussed with patient certain preventive protocols, quality metrics, and best practice recommendations. A written personalized care plan for preventive services as well as general preventive health recommendations were provided to patient.    1. Encounter for Medicare annual wellness exam    Janalyn ShyJohnson, Lainie Daubert, LPN  [id number]/20/2018

## 2017-01-20 ENCOUNTER — Other Ambulatory Visit: Payer: Self-pay | Admitting: Family Medicine

## 2017-01-28 DIAGNOSIS — R7303 Prediabetes: Secondary | ICD-10-CM | POA: Insufficient documentation

## 2017-01-31 ENCOUNTER — Encounter: Payer: Self-pay | Admitting: Radiology

## 2017-05-09 ENCOUNTER — Other Ambulatory Visit: Payer: Self-pay

## 2017-05-09 MED ORDER — IPRATROPIUM BROMIDE 0.03 % NA SOLN: 2.0000 | Freq: Four times a day (QID) | NASAL | 3 refills | Status: DC

## 2017-07-05 ENCOUNTER — Other Ambulatory Visit: Payer: Self-pay | Admitting: Family Medicine

## 2017-07-05 DIAGNOSIS — Z1231 Encounter for screening mammogram for malignant neoplasm of breast: Secondary | ICD-10-CM

## 2017-07-26 ENCOUNTER — Ambulatory Visit: Payer: Medicare Other

## 2017-07-26 ENCOUNTER — Ambulatory Visit
Admission: RE | Admit: 2017-07-26 | Discharge: 2017-07-26 | Disposition: A | Discharge status: Home / Self Care | Payer: Medicare Other | Source: Ambulatory Visit | Attending: Family Medicine | Admitting: Family Medicine

## 2017-07-26 DIAGNOSIS — Z1231 Encounter for screening mammogram for malignant neoplasm of breast: Secondary | ICD-10-CM

## 2017-10-25 ENCOUNTER — Other Ambulatory Visit: Payer: Self-pay

## 2017-10-25 ENCOUNTER — Encounter: Payer: Self-pay | Admitting: Urgent Care

## 2017-10-25 ENCOUNTER — Ambulatory Visit: Payer: Medicare Other | Admitting: Urgent Care

## 2017-10-25 ENCOUNTER — Ambulatory Visit (INDEPENDENT_AMBULATORY_CARE_PROVIDER_SITE_OTHER): Payer: Medicare Other

## 2017-10-25 VITALS — BP 137/74 | HR 76 | Temp 97.9°F | Resp 18 | Ht 65.0 in | Wt 146.8 lb

## 2017-10-25 DIAGNOSIS — R05 Cough: Secondary | ICD-10-CM

## 2017-10-25 DIAGNOSIS — J4530 Mild persistent asthma, uncomplicated: Secondary | ICD-10-CM

## 2017-10-25 DIAGNOSIS — J9801 Acute bronchospasm: Secondary | ICD-10-CM

## 2017-10-25 DIAGNOSIS — R0602 Shortness of breath: Secondary | ICD-10-CM | POA: Diagnosis not present

## 2017-10-25 DIAGNOSIS — R062 Wheezing: Secondary | ICD-10-CM | POA: Diagnosis not present

## 2017-10-25 DIAGNOSIS — R059 Cough, unspecified: Secondary | ICD-10-CM

## 2017-10-25 MED ORDER — BENZONATATE 100 MG PO CAPS: 100.0000 mg | ORAL_CAPSULE | Freq: Three times a day (TID) | ORAL | 0 refills | Status: DC | PRN

## 2017-10-25 MED ORDER — PREDNISONE 20 MG PO TABS: ORAL_TABLET | ORAL | 0 refills | Status: DC

## 2017-10-25 MED ORDER — ACETAMINOPHEN-CODEINE 120-12 MG/5ML PO SOLN: 10.0000 mL | Freq: Every evening | ORAL | 0 refills | Status: DC | PRN

## 2017-10-25 NOTE — Patient Instructions (Addendum)
Cough, Adult Coughing is a reflex that clears your throat and your airways. Coughing helps to heal and protect your lungs. It is normal to cough occasionally, but a cough that happens with other symptoms or lasts a long time may be a sign of a condition that needs treatment. A cough may last only 2-3 weeks (acute), or it may last longer than 8 weeks (chronic). What are the causes? Coughing is commonly caused by:  Breathing in substances that irritate your lungs.  A viral or bacterial respiratory infection.  Allergies.  Asthma.  Postnasal drip.  Smoking.  Acid backing up from the stomach into the esophagus (gastroesophageal reflux).  Certain medicines.  Chronic lung problems, including COPD (or rarely, lung cancer).  Other medical conditions such as heart failure.  Follow these instructions at home: Pay attention to any changes in your symptoms. Take these actions to help with your discomfort:  Take medicines only as told by your health care provider. ? If you were prescribed an antibiotic medicine, take it as told by your health care provider. Do not stop taking the antibiotic even if you start to feel better. ? Talk with your health care provider before you take a cough suppressant medicine.  Drink enough fluid to keep your urine clear or pale yellow.  If the air is dry, use a cold steam vaporizer or humidifier in your bedroom or your home to help loosen secretions.  Avoid anything that causes you to cough at work or at home.  If your cough is worse at night, try sleeping in a semi-upright position.  Avoid cigarette smoke. If you smoke, quit smoking. If you need help quitting, ask your health care provider.  Avoid caffeine.  Avoid alcohol.  Rest as needed.  Contact a health care provider if:  You have new symptoms.  You cough up pus.  Your cough does not get better after 2-3 weeks, or your cough gets worse.  You cannot control your cough with suppressant  medicines and you are losing sleep.  You develop pain that is getting worse or pain that is not controlled with pain medicines.  You have a fever.  You have unexplained weight loss.  You have night sweats. Get help right away if:  You cough up blood.  You have difficulty breathing.  Your heartbeat is very fast. This information is not intended to replace advice given to you by your health care provider. Make sure you discuss any questions you have with your health care provider. Document Released: 07/15/2010 Document Revised: 06/24/2015 Document Reviewed: 03/25/2014 Elsevier Interactive Patient Education  2018 [employer]     If you have lab work done today you will be contacted with your lab results within the next 2 weeks.  If you have not heard from us then please contact us. The fastest way to get your results is to register for My Chart.   IF you received an x-ray today, you will receive an invoice from [facility]. Please contact [facility] at [phone] with questions or concerns regarding your invoice.   IF you received labwork today, you will receive an invoice from [facility]. Please contact [facility] at [phone] with questions or concerns regarding your invoice.   Our billing staff will not be able to assist you with questions regarding bills from these companies.  You will be contacted with the lab results as soon as they are available. The fastest way to get your results is to activate your My Chart account.   Instructions are located on the last page of this paperwork. If you have not heard from us regarding the results in 2 weeks, please contact this office.     

## 2017-10-25 NOTE — Progress Notes (Signed)
    MRN: [id number] DOB: 09/18/1940  Subjective:   Kathryn Gomez is a 77 y.o. female presenting for 4 day history of persistent cough, coughing fits, shob, wheezing especially at night. Denies fever, chest pain, sinus pain, sinus congestion, ear pain.  reports that she has never smoked. She has never used smokeless tobacco. She reports that she does not drink alcohol or use drugs. Has been using albuterol inhaler twice daily.   Kathryn Gomez has a current medication list which includes the following prescription(s): albuterol, amlodipine, ammonium lactate, aspirin, carbamide peroxide, ipratropium, lisinopril-hydrochlorothiazide, loratadine, OVER THE COUNTER MEDICATION, oxybutynin, cvs probiotic maximum strength, and simvastatin. Also has No Known Allergies.  Kathryn Gomez  has a past medical history of Allergy, Arthritis, Asthma, Cataract, Hypercholesteremia, Hypertension, Osteopenia (05/04/2016), and Pneumonia. Also  has a past surgical history that includes Abdominal hysterectomy (Feb 1976); Fracture surgery (Right, 20 years ago); and Total hip arthroplasty (Left, 09/03/2012).  Objective:   Vitals: BP 137/74   Pulse 76   Temp 97.9 F (36.6 C) (Oral)   Resp 18   Ht 5\' 5"  (1.651 m)   Wt 146 lb 12.8 oz (66.6 kg)   SpO2 97%   BMI 24.43 kg/m   Physical Exam  Constitutional: She is oriented to person, place, and time. She appears well-developed and well-nourished. No distress.  HENT:  Mouth/Throat: Oropharynx is clear and moist.  Eyes: Right eye exhibits no discharge. Left eye exhibits no discharge. No scleral icterus.  Cardiovascular: Normal rate, regular rhythm and intact distal pulses. Exam reveals no gallop and no friction rub.  No murmur heard. Pulmonary/Chest: No stridor. No respiratory distress. She has wheezes (inspiratory). She has no rales.  Neurological: She is alert and oriented to person, place, and time.  Skin: Skin is warm and dry. She is not diaphoretic.  Psychiatric: She has a normal mood  and affect.  Jovial.   Assessment and Plan :   Cough - Plan: DG Chest 2 View  Bronchospasm - Plan: DG Chest 2 View  Mild persistent asthma without complication - Plan: DG Chest 2 View  Shortness of breath - Plan: DG Chest 2 View  Wheezing - Plan: DG Chest 2 View  Radiology report pending, will manage her symptoms for bronchospasms in light of her asthma.  Start short steroid course, increase albuterol use to once every 6 hours.  Provided patient with cough suppression medications, states that she is done very well in the past with Tylenol with codeine cough syrup.  Work note provided for patient to rest.  Will use antibiotics as appropriate per chest x-ray. Return-to-clinic precautions discussed, patient verbalized understanding.   Wallis Bamberg, PA-C Primary [facility] at [facility] [phone] 10/25/2017  2:11 PM

## 2017-12-11 ENCOUNTER — Telehealth: Payer: Self-pay | Admitting: Family Medicine

## 2017-12-11 ENCOUNTER — Ambulatory Visit (INDEPENDENT_AMBULATORY_CARE_PROVIDER_SITE_OTHER): Payer: Medicare Other | Admitting: Family Medicine

## 2017-12-11 DIAGNOSIS — Z23 Encounter for immunization: Secondary | ICD-10-CM

## 2017-12-11 NOTE — Progress Notes (Signed)
HD Flu shot Given RD

## 2017-12-11 NOTE — Telephone Encounter (Signed)
Patient came in 12/11/17 to drop off a disability parking placard. I have placed the form in Dr. Alver FisherShaw's box and routed this message to Dr. Clelia CroftShaw. Patient would like a call back at [phone] as soon as the form is completed. Thank you!

## 2017-12-19 NOTE — Telephone Encounter (Signed)
Pt called back to check on the status of her disability parking placard form. She left it with Dr. Clelia CroftShaw on 12/11/17. She states it will expire on 12/20/17 and needs to be able to pick up the form and turn it in as soon as possible. Please call pt to let he know the form is ready for pick up. Ok to leave VM. CB# [phone]

## 2017-12-23 NOTE — Telephone Encounter (Signed)
I have not seen this at all.  It never made it to my box. I did not see pt on 11/12 - she just came in for a flu shot [facility] and under Dr. Paralee CancelGreene's name.  Could form have been put into Greene's box?  Don't know if pt left form with [facility] or with clerical?  If clerical and [facility] don't know location of form, please just new blank handicapped parking placard, complete pt's info, and bring to me for sig.  Thanks.

## 2017-12-24 NOTE — Telephone Encounter (Signed)
Kenney Housemananya put a message in on 12/11/17 stating that she place the form in Dr Alver FisherShaw's box. I will check with her to see if she knows where it is?  Have we asked [place] if she has seen this?

## 2017-12-24 NOTE — Telephone Encounter (Signed)
Pt called back to check on the status of her Handicapped placard paperwork. Please advise as pt needs this as soon as possible. Daughter can pick up when ready CB#[phone] Valrie HartBelinda Carter

## 2017-12-25 NOTE — Telephone Encounter (Signed)
I have gotten a blank placard form and most of it has been completed. I have given the form to the provider to complete her part.   Thanks, [facility]

## 2017-12-26 NOTE — Telephone Encounter (Signed)
Patient stated when the form is ready to please call her daughter, Kathryn HartBelinda Carter [phone] to come pick it up.

## 2017-12-26 NOTE — Telephone Encounter (Signed)
Called pt and informed her a copy of her paperwork will be waiting for her to pick-up.

## 2018-01-26 ENCOUNTER — Encounter: Payer: Self-pay | Admitting: Family Medicine

## 2018-01-26 ENCOUNTER — Ambulatory Visit: Payer: Medicare Other | Admitting: Family Medicine

## 2018-01-26 ENCOUNTER — Other Ambulatory Visit: Payer: Self-pay

## 2018-01-26 VITALS — BP 135/72 | HR 70 | Temp 99.1°F | Ht 65.0 in | Wt 148.0 lb

## 2018-01-26 DIAGNOSIS — H9191 Unspecified hearing loss, right ear: Secondary | ICD-10-CM | POA: Diagnosis not present

## 2018-01-26 DIAGNOSIS — H6121 Impacted cerumen, right ear: Secondary | ICD-10-CM | POA: Diagnosis not present

## 2018-01-26 DIAGNOSIS — T161XXA Foreign body in right ear, initial encounter: Secondary | ICD-10-CM

## 2018-01-26 NOTE — Progress Notes (Signed)
Established Patient Office Visit  Subjective:  Patient ID: Kathryn Gomez, female    DOB: 1940/04/30  Age: 77 y.o. MRN: [id number]  CC:  Chief Complaint  Patient presents with  . right ear blockage    cant hear well for over a week     HPI Kathryn Gomez presents for right ear with decreased hearing She is here with her daughter She denies pain with chewing, yawning or any headaches She denies dizziness, fever or chills She was previously using cotton tips to clean her ears   Past Medical History:  Diagnosis Date  . Allergy   . Arthritis   . Asthma   . Cataract    bilateral  . Hypercholesteremia   . Hypertension   . Osteopenia 05/04/2016   T scare -1.2 at femur  . Pneumonia    hx of    Past Surgical History:  Procedure Laterality Date  . ABDOMINAL HYSTERECTOMY  Feb 1976   Pelvic pain (no cancer)  . FRACTURE SURGERY Right 20 years ago   two fingers  . TOTAL HIP ARTHROPLASTY Left 09/03/2012   Procedure: LEFT TOTAL HIP ARTHROPLASTY ANTERIOR APPROACH;  Surgeon: Shelda Pal, MD;  Location: [facility];  Service: Orthopedics;  Laterality: Left;    Family History  Problem Relation Age of Onset  . Hypertension Mother   . Hypertension Maternal Grandmother   . Hypertension Brother   . Obesity Brother     Social History   Socioeconomic History  . Marital status: Widowed    Spouse name: n/a  . Number of children: 3  . Years of education: 11th grade  . Highest education level: 11th grade  Occupational History  . Occupation: Data processing manager: [employer]  Social Needs  . Financial resource strain: Not hard at all  . Food insecurity:    Worry: Never true    Inability: Never true  . Transportation needs:    Medical: No    Non-medical: No  Tobacco Use  . Smoking status: Never Smoker  . Smokeless tobacco: Never Used  Substance and Sexual Activity  . Alcohol use: No  . Drug use: No  . Sexual activity: Not on file  Lifestyle  . Physical  activity:    Days per week: 0 days    Minutes per session: 0 min  . Stress: Not at all  Relationships  . Social connections:    Talks on phone: Once a week    Gets together: Once a week    Attends religious service: More than 4 times per year    Active member of club or organization: No    Attends meetings of clubs or organizations: Never    Relationship status: Widowed  . Intimate partner violence:    Fear of current or ex partner: No    Emotionally abused: No    Physically abused: No    Forced sexual activity: No  Other Topics Concern  . Not on file  Social History Narrative   Does not exercise.    Grandson lives with her.   Her great grandsons stay with her on the weekends.   Has a very strong Faith.    Outpatient Medications Prior to Visit  Medication Sig Dispense Refill  . albuterol (PROVENTIL HFA;VENTOLIN HFA) 108 (90 Base) MCG/ACT inhaler Inhale 2 puffs into the lungs every 6 (six) hours as needed for wheezing or shortness of breath. 1 Inhaler 12  . amLODipine (NORVASC) 5  MG tablet Take 1 tablet (5 mg total) by mouth daily. 90 tablet 3  . ammonium lactate (LAC-HYDRIN) 12 % lotion Apply 1 application topically as needed for dry skin. 500 g 2  . aspirin 81 MG tablet Take 81 mg by mouth daily.    Marland Kitchen. lisinopril-hydrochlorothiazide (PRINZIDE,ZESTORETIC) 20-12.5 MG tablet Take 2 tablets by mouth daily. 180 tablet 3  . loratadine (CLARITIN) 10 MG tablet Take 10 mg by mouth daily.    Marland Kitchen. OVER THE COUNTER MEDICATION OTC PreserVision eye vitamin and mineral supplement taking one in the am and one at night    . Probiotic Product ([employer] PROBIOTIC MAXIMUM STRENGTH) CAPS Take 1 capsule by mouth daily. 60 capsule 5  . simvastatin (ZOCOR) 20 MG tablet Take 1 tablet (20 mg total) by mouth every evening. 90 tablet 3  . carbamide peroxide (DEBROX) 6.5 % otic solution Place 5 drops into the right ear at bedtime. For 5 days. Use again prior to next visit in 6 mos (Patient not taking: Reported on  01/26/2018) 15 mL 4  . oxybutynin (DITROPAN) 5 MG tablet Take 1 tablet (5 mg total) by mouth at bedtime. (Patient not taking: Reported on 01/26/2018) 90 tablet 3  . acetaminophen-codeine 120-12 MG/5ML solution Take 10 mLs by mouth at bedtime as needed for moderate pain. 100 mL 0  . benzonatate (TESSALON) 100 MG capsule Take 1-2 capsules (100-200 mg total) by mouth 3 (three) times daily as needed. 60 capsule 0  . ipratropium (ATROVENT) 0.03 % nasal spray Place 2 sprays into the nose 4 (four) times daily. 30 mL 3  . predniSONE (DELTASONE) 20 MG tablet Take 2 tablets daily with breakfast. 10 tablet 0   No facility-administered medications prior to visit.     No Known Allergies  ROS Review of Systems See hpi No nausea or vomiting   Objective:    Physical Exam  BP 135/72 (BP Location: Right Arm, Patient Position: Sitting, Cuff Size: Normal)   Pulse 70   Temp 99.1 F (37.3 C) (Oral)   Ht 5\' 5"  (1.651 m)   Wt 148 lb (67.1 kg)   SpO2 99%   BMI 24.63 kg/m  Wt Readings from Last 3 Encounters:  01/26/18 148 lb (67.1 kg)  10/25/17 146 lb 12.8 oz (66.6 kg)  01/18/17 147 lb (66.7 kg)   General: alert, oriented, in NAD Head: normocephalic, atraumatic, no sinus tenderness Eyes: EOM intact, no scleral icterus or conjunctival injection Ears: TM clear on left, right ear with impacted cerumen  Nose: mucosa nonerythematous, nonedematous Throat: no pharyngeal exudate or erythema Lymph: no posterior auricular, submental or cervical lymph adenopathy Heart: normal rate, normal sinus rhythm, no murmurs Lungs: clear to auscultation bilaterally, no wheezing    There are no preventive care reminders to display for this patient.  There are no preventive care reminders to display for this patient.  Lab Results  Component Value Date   TSH 3.420 01/06/2016   Lab Results  Component Value Date   WBC 6.9 01/11/2017   HGB 12.4 01/11/2017   HCT 37.4 01/11/2017   MCV 97 01/11/2017   PLT 308  01/11/2017   Lab Results  Component Value Date   NA 140 01/11/2017   K 4.5 01/11/2017   CO2 23 01/11/2017   GLUCOSE 94 01/11/2017   BUN 19 01/11/2017   CREATININE 1.05 (H) 01/11/2017   BILITOT 0.3 01/11/2017   ALKPHOS 91 01/11/2017   AST 18 01/11/2017   ALT 11 01/11/2017   PROT 7.2  01/11/2017   ALBUMIN 4.4 01/11/2017   CALCIUM 9.5 01/11/2017   Lab Results  Component Value Date   CHOL 151 01/11/2017   Lab Results  Component Value Date   HDL 47 01/11/2017   Lab Results  Component Value Date   LDLCALC 82 01/11/2017   Lab Results  Component Value Date   TRIG 110 01/11/2017   Lab Results  Component Value Date   CHOLHDL 3.2 01/11/2017   Lab Results  Component Value Date   HGBA1C 5.8 (H) 01/11/2017      Assessment & Plan:   Problem List Items Addressed This Visit    None    Visit Diagnoses    Decreased hearing of right ear    -  Primary   Relevant Orders   Ear wax removal   Foreign body of right ear, initial encounter       Relevant Orders   Ear wax removal      Resolved with ear lavage Of not there was cotton tip in the right ear that was removed Provided instructions to stop cleaning the ear at home with cotton swabs   No orders of the defined types were placed in this encounter.   Follow-up: Return if symptoms worsen or fail to improve.    Doristine BosworthZoe A Salwa Bai, MD

## 2018-01-26 NOTE — Patient Instructions (Addendum)
   If you have lab work done today you will be contacted with your lab results within the next 2 weeks.  If you have not heard from us then please contact us. The fastest way to get your results is to register for My Chart.   IF you received an x-ray today, you will receive an invoice from [facility]. Please contact [facility] at [phone] with questions or concerns regarding your invoice.   IF you received labwork today, you will receive an invoice from [facility]. Please contact [facility] at [phone] with questions or concerns regarding your invoice.   Our billing staff will not be able to assist you with questions regarding bills from these companies.  You will be contacted with the lab results as soon as they are available. The fastest way to get your results is to activate your My Chart account. Instructions are located on the last page of this paperwork. If you have not heard from us regarding the results in 2 weeks, please contact this office.     Ear Irrigation What is ear irrigation? Ear irrigation is a procedure to wash dirt and wax out of your ear canal. This procedure is also called lavage. You may need ear irrigation if you are having trouble hearing because of a buildup of earwax. You may also have ear irrigation as part of the treatment for an ear infection. Getting wax and dirt out of your ear canal can help some medicines given as ear drops work better. How is ear irrigation performed? The procedure may vary among health care providers and hospitals. You may be given ear drops to put in your ear 15-20 minutes before irrigation. This helps loosen the wax. Then, a syringe containing water and a sterile salt solution (saline) can be gently inserted into the ear canal. The saline is used to flush out wax and other debris. Ear irrigation kits are also available to use at home. Ask your health care provider if this is an option for you. Use a home irrigation  kit only as told by your health care provider. Read the package instructions carefully. Follow the directions for using the syringe. Use water that is room temperature. Do not do ear irrigation at home if you:  Have diabetes. Diabetes increases the risk of infection.  Have a hole or tear in your eardrum.  Have tubes in your ears. What are the risks of ear irrigation? Generally, this is a safe procedure. However, problems may occur, including:  Infection.  Pain.  Hearing loss.  Pushing water and debris into the eardrum. This can occur if there are holes in the eardrum.  Ear irrigation failing to work. How should I care for my ears after having them irrigated? Cleaning   Clean the outside of your ear with a soft washcloth daily.  If told by your health care provider, use a few drops of baby oil, mineral oil, glycerin, hydrogen peroxide, or over-the-counter earwax softening drops.  Do not use cotton swabs to clean your ears. These can push wax down into the ear canal.  Do not put anything into your ears to try to remove wax. This includes ear candles. General Instructions  Take over-the-counter and prescription medicines only as told by your health care provider.  If you were prescribed an antibiotic medicine, use it as told by your health care provider. Do not stop using the antibiotic even if your condition improves.  Keep all follow-up visits as told by your   health care provider. This is important.  Visit your health care provider at least once a year to have your ears and hearing checked. When should I seek medical care? Seek medical care if:  Your hearing is not improving or is getting worse.  You have pain or redness in your ear.  You have fluid, blood, or pus coming out of your ear. This information is not intended to replace advice given to you by your health care provider. Make sure you discuss any questions you have with your health care provider. Document  Released: 02/12/2015 Document Revised: 08/30/2016 Document Reviewed: 06/25/2014 Elsevier Interactive Patient Education  2019 [employer]vier Inc.  

## 2018-02-11 ENCOUNTER — Other Ambulatory Visit: Payer: Self-pay | Admitting: Family Medicine

## 2018-02-11 DIAGNOSIS — J4530 Mild persistent asthma, uncomplicated: Secondary | ICD-10-CM

## 2018-02-11 NOTE — Telephone Encounter (Signed)
Requested Prescriptions  Pending Prescriptions Disp Refills  . PROAIR HFA 108 (90 Base) MCG/ACT inhaler [Pharmacy Med Name: PROAIR HFA 90 MCG INHALER] 8.5 Inhaler 12    Sig: TAKE 2 PUFFS BY MOUTH EVERY 6 HOURS AS NEEDED FOR WHEEZE OR SHORTNESS OF BREATH     Pulmonology:  Beta Agonists Failed - 02/11/2018  9:30 AM      Failed - One inhaler should last at least one month. If the patient is requesting refills earlier, contact the patient to check for uncontrolled symptoms.      Passed - Valid encounter within last 12 months    Recent Outpatient Visits          2 weeks ago Decreased hearing of right ear   [facility] Care at [facility], Manus Rudd, [place]   3 months ago Cough   Primary Care at [facility], [place], [place]   1 year ago Annual physical exam   [facility] at [facility], Levell July, MD   1 year ago High cholesterol   [facility] at [facility], Levell July, [place]   2 years ago Medicare annual wellness visit, subsequent   Primary Care at [facility], Levell July, MD

## 2018-03-15 ENCOUNTER — Ambulatory Visit (INDEPENDENT_AMBULATORY_CARE_PROVIDER_SITE_OTHER): Payer: Medicare Other | Admitting: Emergency Medicine

## 2018-03-15 ENCOUNTER — Other Ambulatory Visit: Payer: Self-pay

## 2018-03-15 ENCOUNTER — Encounter: Payer: Self-pay | Admitting: Emergency Medicine

## 2018-03-15 VITALS — BP 140/73 | HR 75 | Temp 98.9°F | Resp 12 | Ht 65.0 in | Wt 148.2 lb

## 2018-03-15 DIAGNOSIS — J309 Allergic rhinitis, unspecified: Secondary | ICD-10-CM | POA: Diagnosis not present

## 2018-03-15 DIAGNOSIS — R058 Other specified cough: Secondary | ICD-10-CM

## 2018-03-15 DIAGNOSIS — R05 Cough: Secondary | ICD-10-CM | POA: Diagnosis not present

## 2018-03-15 DIAGNOSIS — J4530 Mild persistent asthma, uncomplicated: Secondary | ICD-10-CM | POA: Diagnosis not present

## 2018-03-15 MED ORDER — AZELASTINE HCL 0.15 % NA SOLN: NASAL | 5 refills | Status: DC

## 2018-03-15 NOTE — Progress Notes (Signed)
Kathryn Gomez 78 y.o.   Chief Complaint  Patient presents with  . 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 also would like a refill on nasal spray     HISTORY OF PRESENT ILLNESS: This is a 78 y.o. female complaining of runny nose with congestion and dry cough that started 2 nights ago.  No other significant symptoms.  No other complaints or medical concerns.  HPI   Prior to Admission medications   Medication Sig Start Date End Date Taking? Authorizing Provider  amLODipine (NORVASC) 5 MG tablet Take 1 tablet (5 mg total) by mouth daily. 01/11/17  Yes Sherren MochaShaw, Eva N, MD  ammonium lactate (LAC-HYDRIN) 12 % lotion Apply 1 application topically as needed for dry skin. 01/11/17  Yes Sherren MochaShaw, Eva N, MD  aspirin 81 MG tablet Take 81 mg by mouth daily.   Yes [provider]  carbamide peroxide (DEBROX) 6.5 % otic solution Place 5 drops into the right ear at bedtime. For 5 days. Use again prior to next visit in 6 mos 01/06/16  Yes Sherren MochaShaw, Eva N, MD  lisinopril-hydrochlorothiazide (PRINZIDE,ZESTORETIC) 20-12.5 MG tablet Take 2 tablets by mouth daily. 01/11/17  Yes Sherren MochaShaw, Eva N, MD  loratadine (CLARITIN) 10 MG tablet Take 10 mg by mouth daily.   Yes [provider]  OVER THE COUNTER MEDICATION OTC PreserVision eye vitamin and mineral supplement taking one in the am and one at night   Yes [provider]  PROAIR HFA 108 (90 Base) MCG/ACT inhaler TAKE 2 PUFFS BY MOUTH EVERY 6 HOURS AS NEEDED FOR WHEEZE OR SHORTNESS OF BREATH 02/11/18  Yes Sherren MochaShaw, Eva N, MD  Probiotic Product ([employer] PROBIOTIC MAXIMUM STRENGTH) CAPS Take 1 capsule by mouth daily. 04/02/14  Yes Maurice MarchMcPherson, Barbara B, MD  simvastatin (ZOCOR) 20 MG tablet Take 1 tablet (20 mg total) by mouth every evening. 01/11/17  Yes Sherren MochaShaw, Eva N, MD  oxybutynin (DITROPAN) 5 MG tablet Take 1 tablet (5 mg total) by mouth at bedtime. Patient not taking: Reported on 03/15/2018 01/11/17    Sherren MochaShaw, Eva N, MD    No Known Allergies  Patient Active Problem List   Diagnosis Date Noted  . Prediabetes 01/28/2017  . Bilateral low back pain without sciatica 10/05/2015  . Diarrhea 09/24/2014  . Overweight (BMI 25.0-29.9) 09/04/2012  . S/P left THA, AA 09/03/2012  . High cholesterol 03/26/2011  . Hypertension 03/26/2011  . Asthma 03/26/2011    Past Medical History:  Diagnosis Date  . Allergy   . Arthritis   . Asthma   . Cataract    bilateral  . Hypercholesteremia   . Hypertension   . Osteopenia 05/04/2016   T scare -1.2 at femur  . Pneumonia    hx of    Past Surgical History:  Procedure Laterality Date  . ABDOMINAL HYSTERECTOMY  Feb 1976   Pelvic pain (no cancer)  . FRACTURE SURGERY Right 20 years ago   two fingers  . TOTAL HIP ARTHROPLASTY Left 09/03/2012   Procedure: LEFT TOTAL HIP ARTHROPLASTY ANTERIOR APPROACH;  Surgeon: Shelda PalMatthew D Olin, MD;  Location: [facility];  Service: Orthopedics;  Laterality: Left;    Social History   Socioeconomic History  . Marital status: Widowed    Spouse name: n/a  . Number of children: 3  . Years of education: 11th grade  . Highest education level: 11th grade  Occupational History  . Occupation: Biomedical scientistline server  Employer: [employer]  Social Needs  . Financial resource strain: Not hard at all  . Food insecurity:    Worry: Never true    Inability: Never true  . Transportation needs:    Medical: No    Non-medical: No  Tobacco Use  . Smoking status: Never Smoker  . Smokeless tobacco: Never Used  Substance and Sexual Activity  . Alcohol use: No  . Drug use: No  . Sexual activity: Not on file  Lifestyle  . Physical activity:    Days per week: 0 days    Minutes per session: 0 min  . Stress: Not at all  Relationships  . Social connections:    Talks on phone: Once a week    Gets together: Once a week    Attends religious service: More than 4 times per year    Active member of club or organization: No     Attends meetings of clubs or organizations: Never    Relationship status: Widowed  . Intimate partner violence:    Fear of current or ex partner: No    Emotionally abused: No    Physically abused: No    Forced sexual activity: No  Other Topics Concern  . Not on file  Social History Narrative   Does not exercise.    Grandson lives with her.   Her great grandsons stay with her on the weekends.   Has a very strong Faith.    Family History  Problem Relation Age of Onset  . Hypertension Mother   . Hypertension Maternal Grandmother   . Hypertension Brother   . Obesity Brother      Review of Systems  Constitutional: Negative.  Negative for chills and fever.  HENT: Positive for congestion. Negative for sore throat.        Runny nose  Eyes: Negative.   Respiratory: Positive for cough. Negative for shortness of breath.   Cardiovascular: Negative.  Negative for chest pain and palpitations.  Gastrointestinal: Negative.  Negative for abdominal pain, diarrhea, nausea and vomiting.  Genitourinary: Negative.   Musculoskeletal: Negative.  Negative for myalgias and neck pain.  Skin: Negative.  Negative for rash.  Neurological: Negative.  Negative for dizziness and headaches.  Endo/Heme/Allergies: Negative.   All other systems reviewed and are negative.  Vitals:   03/15/18 1042  BP: 140/73  Pulse: 75  Resp: 12  Temp: 98.9 F (37.2 C)  SpO2: 99%     Physical Exam Vitals signs reviewed.  Constitutional:      Appearance: Normal appearance.  HENT:     Head: Normocephalic and atraumatic.     Right Ear: Tympanic membrane and ear canal normal.     Left Ear: Tympanic membrane and ear canal normal.     Nose: Rhinorrhea present.     Mouth/Throat:     Mouth: Mucous membranes are moist.     Pharynx: Oropharynx is clear.  Eyes:     Extraocular Movements: Extraocular movements intact.     Conjunctiva/sclera: Conjunctivae normal.     Pupils: Pupils are equal, round, and reactive to  light.  Neck:     Musculoskeletal: Normal range of motion and neck supple. No muscular tenderness.  Cardiovascular:     Rate and Rhythm: Normal rate and regular rhythm.     Heart sounds: Normal heart sounds.  Pulmonary:     Effort: Pulmonary effort is normal.     Breath sounds: Normal breath sounds.  Musculoskeletal: Normal range of motion.  Lymphadenopathy:     Cervical: No cervical adenopathy.  Skin:    General: Skin is warm and dry.     Capillary Refill: Capillary refill takes less than 2 seconds.  Neurological:     General: No focal deficit present.     Mental Status: She is alert and oriented to person, place, and time.  Psychiatric:        Mood and Affect: Mood normal.        Behavior: Behavior normal.    A total of 25 minutes was spent in the room with the patient, greater than 50% of which was in counseling/coordination of care regarding differential diagnosis, treatment, medications and side effects, prognosis, and need for follow-up if no better results.   ASSESSMENT & PLAN: Skilar was seen today for uri.  Diagnoses and all orders for this visit:  Allergic rhinitis, unspecified seasonality, unspecified trigger -     Azelastine HCl 0.15 % SOLN; Sig 1 spray in each nostril twice daily for the next 5 days.  Mild persistent asthma without complication  Nocturnal cough    Patient Instructions       If you have lab work done today you will be contacted with your lab results within the next 2 weeks.  If you have not heard from [place] then please contact us. The fastest way to get your results is to register for My Chart.   IF you received an x-ray today, you will receive an invoice from [facility]. Please contact [facility] at [phone] with questions or concerns regarding your invoice.   IF you received labwork today, you will receive an invoice from [facility]. Please contact [facility] at [phone] with questions or concerns regarding your  invoice.   Our billing staff will not be able to assist you with questions regarding bills from these companies.  You will be contacted with the lab results as soon as they are available. The fastest way to get your results is to activate your My Chart account. Instructions are located on the last page of this paperwork. If you have not heard from [place] regarding the results in 2 weeks, please contact this office.      Allergic Rhinitis, Adult Allergic rhinitis is a reaction to allergens in the air. Allergens are tiny specks (particles) in the air that cause your body to have an allergic reaction. This condition cannot be passed from person to person (is not contagious). Allergic rhinitis cannot be cured, but it can be controlled. There are two types of allergic rhinitis:  Seasonal. This type is also called hay fever. It happens only during certain times of the year.  Perennial. This type can happen at any time of the year. What are the causes? This condition may be caused by:  Pollen from grasses, trees, and weeds.  House dust mites.  Pet dander.  Mold. What are the signs or symptoms? Symptoms of this condition include:  Sneezing.  Runny or stuffy nose (nasal congestion).  A lot of mucus in the back of the throat (postnasal drip).  Itchy nose.  Tearing of the eyes.  Trouble sleeping.  Being sleepy during day. How is this treated? There is no cure for this condition. You should avoid things that trigger your symptoms (allergens). Treatment can help to relieve symptoms. This may include:  Medicines that block allergy symptoms, such as antihistamines. These may be given as a shot, nasal spray, or pill.  Shots that are given until your body becomes less  sensitive to the allergen (desensitization).  Stronger medicines, if all other treatments have not worked. Follow these instructions at home: Avoiding allergens   Find out what you are allergic to. Common allergens  include smoke, dust, and pollen.  Avoid them if you can. These are some of the things that you can do to avoid allergens: ? Replace carpet with wood, tile, or vinyl flooring. Carpet can trap dander and dust. ? Clean any mold found in the home. ? Do not smoke. Do not allow smoking in your home. ? Change your heating and air conditioning filter at least once a month. ? During allergy season:  Keep windows closed as much as you can. If possible, use air conditioning when there is a lot of pollen in the air.  Use a special filter for allergies with your furnace and air conditioner.  Plan outdoor activities when pollen counts are lowest. This is usually during the early morning or evening hours.  If you do go outdoors when pollen count is high, wear a special mask for people with allergies.  When you come indoors, take a shower and change your clothes before sitting on furniture or bedding. General instructions  Do not use fans in your home.  Do not hang clothes outside to dry.  Wear sunglasses to keep pollen out of your eyes.  Wash your hands right away after you touch household pets.  Take over-the-counter and prescription medicines only as told by your doctor.  Keep all follow-up visits as told by your doctor. This is important. Contact a doctor if:  You have a fever.  You have a cough that does not go away (is persistent).  You start to make whistling sounds when you breathe (wheeze).  Your symptoms do not get better with treatment.  You have thick fluid coming from your nose.  You start to have nosebleeds. Get help right away if:  Your tongue or your lips are swollen.  You have trouble breathing.  You feel dizzy or you feel like you are going to pass out (faint).  You have cold sweats. Summary  Allergic rhinitis is a reaction to allergens in the air.  This condition may be caused by allergens. These include pollen, dust mites, pet dander, and mold.  Symptoms  include a runny, itchy nose, sneezing, or tearing eyes. You may also have trouble sleeping or feel sleepy during the day.  Treatment includes taking medicines and avoiding allergens. You may also get shots or take stronger medicines.  Get help if you have a fever or a cough that does not stop. Get help right away if you are short of breath. This information is not intended to replace advice given to you by your health care provider. Make sure you discuss any questions you have with your health care provider. Document Released: 05/18/2010 Document Revised: 08/07/2017 Document Reviewed: 08/07/2017 Elsevier Interactive Patient Education  2019 Elsevier Inc.      Edwina Barth, MD [facility]

## 2018-03-15 NOTE — Patient Instructions (Addendum)
   If you have lab work done today you will be contacted with your lab results within the next 2 weeks.  If you have not heard from us then please contact us. The fastest way to get your results is to register for My Chart.   IF you received an x-ray today, you will receive an invoice from [facility]. Please contact [facility] at [phone] with questions or concerns regarding your invoice.   IF you received labwork today, you will receive an invoice from [facility]. Please contact [facility] at [phone] with questions or concerns regarding your invoice.   Our billing staff will not be able to assist you with questions regarding bills from these companies.  You will be contacted with the lab results as soon as they are available. The fastest way to get your results is to activate your My Chart account. Instructions are located on the last page of this paperwork. If you have not heard from us regarding the results in 2 weeks, please contact this office.     Allergic Rhinitis, Adult Allergic rhinitis is a reaction to allergens in the air. Allergens are tiny specks (particles) in the air that cause your body to have an allergic reaction. This condition cannot be passed from person to person (is not contagious). Allergic rhinitis cannot be cured, but it can be controlled. There are two types of allergic rhinitis:  Seasonal. This type is also called hay fever. It happens only during certain times of the year.  Perennial. This type can happen at any time of the year. What are the causes? This condition may be caused by:  Pollen from grasses, trees, and weeds.  House dust mites.  Pet dander.  Mold. What are the signs or symptoms? Symptoms of this condition include:  Sneezing.  Runny or stuffy nose (nasal congestion).  A lot of mucus in the back of the throat (postnasal drip).  Itchy nose.  Tearing of the eyes.  Trouble sleeping.  Being sleepy during  day. How is this treated? There is no cure for this condition. You should avoid things that trigger your symptoms (allergens). Treatment can help to relieve symptoms. This may include:  Medicines that block allergy symptoms, such as antihistamines. These may be given as a shot, nasal spray, or pill.  Shots that are given until your body becomes less sensitive to the allergen (desensitization).  Stronger medicines, if all other treatments have not worked. Follow these instructions at home: Avoiding allergens   Find out what you are allergic to. Common allergens include smoke, dust, and pollen.  Avoid them if you can. These are some of the things that you can do to avoid allergens: ? Replace carpet with wood, tile, or vinyl flooring. Carpet can trap dander and dust. ? Clean any mold found in the home. ? Do not smoke. Do not allow smoking in your home. ? Change your heating and air conditioning filter at least once a month. ? During allergy season:  Keep windows closed as much as you can. If possible, use air conditioning when there is a lot of pollen in the air.  Use a special filter for allergies with your furnace and air conditioner.  Plan outdoor activities when pollen counts are lowest. This is usually during the early morning or evening hours.  If you do go outdoors when pollen count is high, wear a special mask for people with allergies.  When you come indoors, take a shower and change your   clothes before sitting on furniture or bedding. General instructions  Do not use fans in your home.  Do not hang clothes outside to dry.  Wear sunglasses to keep pollen out of your eyes.  Wash your hands right away after you touch household pets.  Take over-the-counter and prescription medicines only as told by your doctor.  Keep all follow-up visits as told by your doctor. This is important. Contact a doctor if:  You have a fever.  You have a cough that does not go away (is  persistent).  You start to make whistling sounds when you breathe (wheeze).  Your symptoms do not get better with treatment.  You have thick fluid coming from your nose.  You start to have nosebleeds. Get help right away if:  Your tongue or your lips are swollen.  You have trouble breathing.  You feel dizzy or you feel like you are going to pass out (faint).  You have cold sweats. Summary  Allergic rhinitis is a reaction to allergens in the air.  This condition may be caused by allergens. These include pollen, dust mites, pet dander, and mold.  Symptoms include a runny, itchy nose, sneezing, or tearing eyes. You may also have trouble sleeping or feel sleepy during the day.  Treatment includes taking medicines and avoiding allergens. You may also get shots or take stronger medicines.  Get help if you have a fever or a cough that does not stop. Get help right away if you are short of breath. This information is not intended to replace advice given to you by your health care provider. Make sure you discuss any questions you have with your health care provider. Document Released: 05/18/2010 Document Revised: 08/07/2017 Document Reviewed: 08/07/2017 Elsevier Interactive Patient Education  2019 Elsevier Inc.  

## 2018-03-18 ENCOUNTER — Other Ambulatory Visit: Payer: Self-pay | Admitting: Family Medicine

## 2018-03-18 DIAGNOSIS — E7800 Pure hypercholesterolemia, unspecified: Secondary | ICD-10-CM

## 2018-03-18 DIAGNOSIS — I10 Essential (primary) hypertension: Secondary | ICD-10-CM

## 2018-03-18 NOTE — Telephone Encounter (Signed)
Appointment scheduled for 04/11/18. Courtesy refills given Requested Prescriptions  Pending Prescriptions Disp Refills  . amLODipine (NORVASC) 5 MG tablet [Pharmacy Med Name: AMLODIPINE BESYLATE 5 MG TAB] 90 tablet 0    Sig: TAKE 1 TABLET BY MOUTH EVERY DAY     Cardiovascular:  Calcium Channel Blockers Failed - 03/18/2018  1:15 AM      Failed - Last BP in normal range    BP Readings from Last 1 Encounters:  03/15/18 140/73         Passed - Valid encounter within last 6 months    Recent Outpatient Visits          3 days ago Allergic rhinitis, unspecified seasonality, unspecified trigger   Primary Care at [facility], Eilleen Kempf, MD   1 month ago Decreased hearing of right ear   Primary Care at [facility], Manus Rudd, MD   4 months ago Cough   Primary Care at [facility], [place], [place]   1 year ago Annual physical exam   [facility] at [facility], Levell July, MD   1 year ago High cholesterol   [facility] at [facility], Levell July, MD      Future Appointments            In 3 weeks Sherren Mocha, MD Primary Care at [place], [facility]         . simvastatin (ZOCOR) 20 MG tablet [Pharmacy Med Name: SIMVASTATIN 20 MG TABLET] 30 tablet 0    Sig: TAKE 1 TABLET BY MOUTH EVERY DAY IN THE EVENING     Cardiovascular:  Antilipid - Statins Failed - 03/18/2018  1:15 AM      Failed - Total Cholesterol in normal range and within 360 days    Cholesterol, Total  Date Value Ref Range Status  01/11/2017 151 100 - 199 mg/dL Final         Failed - LDL in normal range and within 360 days    LDL Calculated  Date Value Ref Range Status  01/11/2017 82 0 - 99 mg/dL Final         Failed - HDL in normal range and within 360 days    HDL  Date Value Ref Range Status  01/11/2017 47 >39 mg/dL Final         Failed - Triglycerides in normal range and within 360 days    Triglycerides  Date Value Ref Range Status  01/11/2017 110 0 - 149 mg/dL Final         Passed - Patient is not pregnant      Passed -  Valid encounter within last 12 months    Recent Outpatient Visits          3 days ago Allergic rhinitis, unspecified seasonality, unspecified trigger   Primary Care at [facility], Eilleen Kempf, MD   1 month ago Decreased hearing of right ear   [facility] at [facility], Manus Rudd, MD   4 months ago Cough   Primary Care at [facility], [place], [place]   1 year ago Annual physical exam   [facility] at [facility], Levell July, MD   1 year ago High cholesterol   Primary Care at [facility], Levell July, MD      Future Appointments            In 3 weeks Sherren Mocha, MD Primary Care at [place], [facility]         . lisinopril-hydrochlorothiazide (PRINZIDE,ZESTORETIC)  20-12.5 MG tablet [Pharmacy Med Name: LISINOPRIL-HCTZ 20-12.5 MG TAB] 60 tablet 0    Sig: TAKE 2 TABLETS BY MOUTH EVERY DAY     Cardiovascular:  ACEI + Diuretic Combos Failed - 03/18/2018  1:15 AM      Failed - Na in normal range and within 180 days    Sodium  Date Value Ref Range Status  01/11/2017 140 134 - 144 mmol/L Final         Failed - K in normal range and within 180 days    Potassium  Date Value Ref Range Status  01/11/2017 4.5 3.5 - 5.2 mmol/L Final         Failed - Cr in normal range and within 180 days    Creat  Date Value Ref Range Status  07/01/2015 1.05 (H) 0.60 - 0.93 mg/dL Final   Creatinine, Ser  Date Value Ref Range Status  01/11/2017 1.05 (H) 0.57 - 1.00 mg/dL Final         Failed - Ca in normal range and within 180 days    Calcium  Date Value Ref Range Status  01/11/2017 9.5 8.7 - 10.3 mg/dL Final         Failed - Last BP in normal range    BP Readings from Last 1 Encounters:  03/15/18 140/73         Passed - Patient is not pregnant      Passed - Valid encounter within last 6 months    Recent Outpatient Visits          3 days ago Allergic rhinitis, unspecified seasonality, unspecified trigger   Primary Care at [facility], Eilleen Kempf, MD   1 month ago Decreased hearing of right ear    [facility] at [facility], Manus Rudd, MD   4 months ago Cough   Primary Care at [facility], [place], [place]   1 year ago Annual physical exam   [facility] at [facility], Levell July, MD   1 year ago High cholesterol   Primary Care at [facility], Levell July, MD      Future Appointments            In 3 weeks Sherren Mocha, MD Primary Care at [place], [facility]

## 2018-03-28 ENCOUNTER — Other Ambulatory Visit: Payer: Self-pay

## 2018-03-28 ENCOUNTER — Ambulatory Visit (INDEPENDENT_AMBULATORY_CARE_PROVIDER_SITE_OTHER): Payer: Medicare Other | Admitting: Family Medicine

## 2018-03-28 ENCOUNTER — Ambulatory Visit (INDEPENDENT_AMBULATORY_CARE_PROVIDER_SITE_OTHER): Payer: Medicare Other

## 2018-03-28 ENCOUNTER — Encounter: Payer: Self-pay | Admitting: Family Medicine

## 2018-03-28 VITALS — BP 147/77 | HR 72 | Temp 98.1°F | Ht 65.0 in | Wt 148.6 lb

## 2018-03-28 DIAGNOSIS — R059 Cough, unspecified: Secondary | ICD-10-CM

## 2018-03-28 DIAGNOSIS — R05 Cough: Secondary | ICD-10-CM

## 2018-03-28 DIAGNOSIS — J45901 Unspecified asthma with (acute) exacerbation: Secondary | ICD-10-CM

## 2018-03-28 MED ORDER — BENZONATATE 100 MG PO CAPS: 100.0000 mg | ORAL_CAPSULE | Freq: Two times a day (BID) | ORAL | 0 refills | Status: DC | PRN

## 2018-03-28 MED ORDER — PREDNISONE 20 MG PO TABS: 40.0000 mg | ORAL_TABLET | Freq: Every day | ORAL | 0 refills | Status: DC

## 2018-03-28 MED ORDER — ALBUTEROL SULFATE (2.5 MG/3ML) 0.083% IN NEBU
2.5000 mg | INHALATION_SOLUTION | Freq: Once | RESPIRATORY_TRACT | Status: AC
  Administered 2018-03-28: 2.5 mg via RESPIRATORY_TRACT

## 2018-03-28 NOTE — Patient Instructions (Addendum)
Your lungs sound better after the nebulizer treatment.  I think your cough is likely related to asthma.  Prednisone 2 pills/day for the next 3 days should help.  Tessalon Perles if needed for cough.  If you are having coughing fits or wheezing please try the albuterol inhaler first.  If not improving into next week or any worsening sooner please return for recheck here or emergency room.  Return to the clinic or go to the nearest emergency room if any of your symptoms worsen or new symptoms occur.   Cough, Adult  Coughing is a reflex that clears your throat and your airways. Coughing helps to heal and protect your lungs. It is normal to cough occasionally, but a cough that happens with other symptoms or lasts a long time may be a sign of a condition that needs treatment. A cough may last only 2-3 weeks (acute), or it may last longer than 8 weeks (chronic). What are the causes? Coughing is commonly caused by:  Breathing in substances that irritate your lungs.  A viral or bacterial respiratory infection.  Allergies.  Asthma.  Postnasal drip.  Smoking.  Acid backing up from the stomach into the esophagus (gastroesophageal reflux).  Certain medicines.  Chronic lung problems, including COPD (or rarely, lung cancer).  Other medical conditions such as heart failure. Follow these instructions at home: Pay attention to any changes in your symptoms. Take these actions to help with your discomfort:  Take medicines only as told by your health care provider. ? If you were prescribed an antibiotic medicine, take it as told by your health care provider. Do not stop taking the antibiotic even if you start to feel better. ? Talk with your health care provider before you take a cough suppressant medicine.  Drink enough fluid to keep your urine clear or pale yellow.  If the air is dry, use a cold steam vaporizer or humidifier in your bedroom or your home to help loosen secretions.  Avoid  anything that causes you to cough at work or at home.  If your cough is worse at night, try sleeping in a semi-upright position.  Avoid cigarette smoke. If you smoke, quit smoking. If you need help quitting, ask your health care provider.  Avoid caffeine.  Avoid alcohol.  Rest as needed. Contact a health care provider if:  You have new symptoms.  You cough up pus.  Your cough does not get better after 2-3 weeks, or your cough gets worse.  You cannot control your cough with suppressant medicines and you are losing sleep.  You develop pain that is getting worse or pain that is not controlled with pain medicines.  You have a fever.  You have unexplained weight loss.  You have night sweats. Get help right away if:  You cough up blood.  You have difficulty breathing.  Your heartbeat is very fast. This information is not intended to replace advice given to you by your health care provider. Make sure you discuss any questions you have with your health care provider. Document Released: 07/15/2010 Document Revised: 06/24/2015 Document Reviewed: 03/25/2014 Elsevier Interactive Patient Education  [employer].     If you have lab work done today you will be contacted with your lab results within the next 2 weeks.  If you have not heard from [place] then please contact us. The fastest way to get your results is to register for My Chart.   IF you received an x-ray today, you will  receive an Economist from [facility]. Please contact [facility] at [phone] with questions or concerns regarding your invoice.   IF you received labwork today, you will receive an invoice from [place]. Please contact [facility] at [phone] with questions or concerns regarding your invoice.   Our billing staff will not be able to assist you with questions regarding bills from these companies.  You will be contacted with the lab results as soon as they are available. The fastest  way to get your results is to activate your My Chart account. Instructions are located on the last page of this paperwork. If you have not heard from [place] regarding the results in 2 weeks, please contact this office.

## 2018-03-28 NOTE — Progress Notes (Signed)
Subjective:    Patient ID: Kathryn Gomez, female    DOB: 07/27/1940, 78 y.o.   MRN: [id number]  HPI Kathryn Gomez is a 78 y.o. female Presents today for: Chief Complaint  Patient presents with  . Cough    few days    Hx allergic rhinitis, asthma.  Cough for 2 weeks.  Cough feels worse since last visit on 03/15/18. Runny nose, congestion, dry cough at that visit - thought to be URI.  Cough during the day and at night. Slight wheeze. Using albuterol once per day - same as prior to current illness. . Cost prohibitive to use more often.  atrovent nasal spray- stopped after 5 days. No PND, congestion/rhinoorhea.   Tx: delsym.    Patient Active Problem List   Diagnosis Date Noted  . Prediabetes 01/28/2017  . Bilateral low back pain without sciatica 10/05/2015  . Diarrhea 09/24/2014  . Overweight (BMI 25.0-29.9) 09/04/2012  . S/P left THA, AA 09/03/2012  . High cholesterol 03/26/2011  . Hypertension 03/26/2011  . Asthma 03/26/2011   Past Medical History:  Diagnosis Date  . Allergy   . Arthritis   . Asthma   . Cataract    bilateral  . Hypercholesteremia   . Hypertension   . Osteopenia 05/04/2016   T scare -1.2 at femur  . Pneumonia    hx of   Past Surgical History:  Procedure Laterality Date  . ABDOMINAL HYSTERECTOMY  Feb 1976   Pelvic pain (no cancer)  . FRACTURE SURGERY Right 20 years ago   two fingers  . TOTAL HIP ARTHROPLASTY Left 09/03/2012   Procedure: LEFT TOTAL HIP ARTHROPLASTY ANTERIOR APPROACH;  Surgeon: Shelda Pal, MD;  Location: [facility];  Service: Orthopedics;  Laterality: Left;   No Known Allergies Prior to Admission medications   Medication Sig Start Date End Date Taking? Authorizing Provider  amLODipine (NORVASC) 5 MG tablet TAKE 1 TABLET BY MOUTH EVERY DAY 03/18/18  Yes Sherren Mocha, MD  ammonium lactate (LAC-HYDRIN) 12 % lotion Apply 1 application topically as needed for dry skin. 01/11/17  Yes Sherren Mocha, MD  aspirin 81 MG tablet Take 81 mg by  mouth daily.   Yes [provider]  Azelastine HCl 0.15 % SOLN Sig 1 spray in each nostril twice daily for the next 5 days. 03/15/18  Yes Sagardia, Eilleen Kempf, MD  carbamide peroxide [facility]) 6.5 % otic solution Place 5 drops into the right ear at bedtime. For 5 days. Use again prior to next visit in 6 mos 01/06/16  Yes Sherren Mocha, MD  lisinopril-hydrochlorothiazide (PRINZIDE,ZESTORETIC) 20-12.5 MG tablet TAKE 2 TABLETS BY MOUTH EVERY DAY 03/18/18  Yes Sherren Mocha, MD  loratadine (CLARITIN) 10 MG tablet Take 10 mg by mouth daily.   Yes [provider]  OVER THE COUNTER MEDICATION OTC PreserVision eye vitamin and mineral supplement taking one in the am and one at night   Yes [provider]  oxybutynin (DITROPAN) 5 MG tablet Take 1 tablet (5 mg total) by mouth at bedtime. 01/11/17  Yes Sherren Mocha, MD  PROAIR HFA 108 [phone] Base) MCG/ACT inhaler TAKE 2 PUFFS BY MOUTH EVERY 6 HOURS AS NEEDED FOR WHEEZE OR SHORTNESS OF BREATH 02/11/18  Yes Sherren Mocha, MD  Probiotic Product ([employer] PROBIOTIC MAXIMUM STRENGTH) CAPS Take 1 capsule by mouth daily. 04/02/14  Yes Maurice March, MD  simvastatin (ZOCOR) 20 MG tablet TAKE 1 TABLET BY MOUTH EVERY DAY IN THE  EVENING 03/18/18  Yes Sherren Mocha, MD   Social History   Socioeconomic History  . Marital status: Widowed    Spouse name: n/a  . Number of children: 3  . Years of education: 11th grade  . Highest education level: 11th grade  Occupational History  . Occupation: Data processing manager: [employer]  Social Needs  . Financial resource strain: Not hard at all  . Food insecurity:    Worry: Never true    Inability: Never true  . Transportation needs:    Medical: No    Non-medical: No  Tobacco Use  . Smoking status: Never Smoker  . Smokeless tobacco: Never Used  Substance and Sexual Activity  . Alcohol use: No  . Drug use: No  . Sexual activity: Not on file  Lifestyle  . Physical activity:    Days per week: 0  days    Minutes per session: 0 min  . Stress: Not at all  Relationships  . Social connections:    Talks on phone: Once a week    Gets together: Once a week    Attends religious service: More than 4 times per year    Active member of club or organization: No    Attends meetings of clubs or organizations: Never    Relationship status: Widowed  . Intimate partner violence:    Fear of current or ex partner: No    Emotionally abused: No    Physically abused: No    Forced sexual activity: No  Other Topics Concern  . Not on file  Social History Narrative   Does not exercise.    Grandson lives with her.   Her great grandsons stay with her on the weekends.   Has a very strong Faith.    Review of Systems Per HPI.     Objective:   Physical Exam Vitals signs reviewed.  Constitutional:      General: She is not in acute distress.    Appearance: She is well-developed.  HENT:     Head: Normocephalic and atraumatic.     Right Ear: Hearing, tympanic membrane, ear canal and external ear normal.     Left Ear: Hearing, tympanic membrane, ear canal and external ear normal.     Nose: Nose normal.     Mouth/Throat:     Pharynx: No oropharyngeal exudate.  Eyes:     Conjunctiva/sclera: Conjunctivae normal.     Pupils: Pupils are equal, round, and reactive to light.  Cardiovascular:     Rate and Rhythm: Normal rate and regular rhythm.     Heart sounds: Normal heart sounds. No murmur.  Pulmonary:     Effort: Pulmonary effort is normal. No respiratory distress.     Breath sounds: Wheezing (expiratory with slight decr airflow. no distress. ) present. No rhonchi.  Skin:    General: Skin is warm and dry.     Findings: No rash.  Neurological:     Mental Status: She is alert and oriented to person, place, and time.  Psychiatric:        Behavior: Behavior normal.    Vitals:   03/28/18 1220  BP: (!) 147/77  Pulse: 72  Temp: 98.1 F (36.7 C)  TempSrc: Oral  SpO2: 98%  Weight: 148 lb 9.6  oz (67.4 kg)  Height: 5\' 5"  (1.651 m)   Dg Chest 2 View  Result Date: 03/28/2018 CLINICAL DATA:  Cough. EXAM: CHEST - 2 VIEW COMPARISON:  10/25/2017. FINDINGS: Mediastinum hilar structures normal. Lungs are clear. No pleural effusion or pneumothorax. Heart size normal. Mild thoracic spine scoliosis and degenerative change. IMPRESSION: No acute cardiopulmonary disease. Electronically Signed   By: Maisie Fus  Register   On: 03/28/2018 13:08   1:18 PM Improved aeration after albuterol neb. Clear. Feels better.      Assessment & Plan:    Kathryn Gomez is a 78 y.o. female Cough - Plan: DG Chest 2 View, benzonatate (TESSALON) 100 MG capsule  Asthma with acute exacerbation, unspecified asthma severity, unspecified whether persistent - Plan: albuterol (PROVENTIL) (2.5 MG/3ML) 0.083% nebulizer solution 2.5 mg, predniSONE (DELTASONE) 20 MG tablet, benzonatate (TESSALON) 100 MG capsule  -Suspected initial viral illness with flare of asthma.  Upper respiratory infection symptoms have improved.  Cleared wheeze with 1 nebulizer treatment with albuterol.  Symptomatically improved.  -start prednisone 40 mg daily x3 days, potential side effects and risk discussed  -Albuterol inhaler, has at home, discussed use for wheezing/coughing fits initially, or Tessalon Perles 3 times daily as needed.  RTC precautions/  Meds ordered this encounter  Medications  . albuterol (PROVENTIL) (2.5 MG/3ML) 0.083% nebulizer solution 2.5 mg  . predniSONE (DELTASONE) 20 MG tablet    Sig: Take 2 tablets (40 mg total) by mouth daily with breakfast.    Dispense:  6 tablet    Refill:  0  . benzonatate (TESSALON) 100 MG capsule    Sig: Take 1 capsule (100 mg total) by mouth 2 (two) times daily as needed for cough.    Dispense:  20 capsule    Refill:  0   Patient Instructions   Your lungs sound better after the nebulizer treatment.  I think your cough is likely related to asthma.  Prednisone 2 pills/day for the next 3 days should  help.  Tessalon Perles if needed for cough.  If you are having coughing fits or wheezing please try the albuterol inhaler first.  If not improving into next week or any worsening sooner please return for recheck here or emergency room.  Return to the clinic or go to the nearest emergency room if any of your symptoms worsen or new symptoms occur.   Cough, Adult  Coughing is a reflex that clears your throat and your airways. Coughing helps to heal and protect your lungs. It is normal to cough occasionally, but a cough that happens with other symptoms or lasts a long time may be a sign of a condition that needs treatment. A cough may last only 2-3 weeks (acute), or it may last longer than 8 weeks (chronic). What are the causes? Coughing is commonly caused by:  Breathing in substances that irritate your lungs.  A viral or bacterial respiratory infection.  Allergies.  Asthma.  Postnasal drip.  Smoking.  Acid backing up from the stomach into the esophagus (gastroesophageal reflux).  Certain medicines.  Chronic lung problems, including COPD (or rarely, lung cancer).  Other medical conditions such as heart failure. Follow these instructions at home: Pay attention to any changes in your symptoms. Take these actions to help with your discomfort:  Take medicines only as told by your health care provider. ? If you were prescribed an antibiotic medicine, take it as told by your health care provider. Do not stop taking the antibiotic even if you start to feel better. ? Talk with your health care provider before you take a cough suppressant medicine.  Drink enough fluid to keep your urine clear or pale yellow.  If  the air is dry, use a cold steam vaporizer or humidifier in your bedroom or your home to help loosen secretions.  Avoid anything that causes you to cough at work or at home.  If your cough is worse at night, try sleeping in a semi-upright position.  Avoid cigarette smoke. If  you smoke, quit smoking. If you need help quitting, ask your health care provider.  Avoid caffeine.  Avoid alcohol.  Rest as needed. Contact a health care provider if:  You have new symptoms.  You cough up pus.  Your cough does not get better after 2-3 weeks, or your cough gets worse.  You cannot control your cough with suppressant medicines and you are losing sleep.  You develop pain that is getting worse or pain that is not controlled with pain medicines.  You have a fever.  You have unexplained weight loss.  You have night sweats. Get help right away if:  You cough up blood.  You have difficulty breathing.  Your heartbeat is very fast. This information is not intended to replace advice given to you by your health care provider. Make sure you discuss any questions you have with your health care provider. Document Released: 07/15/2010 Document Revised: 06/24/2015 Document Reviewed: 03/25/2014 Elsevier Interactive Patient Education  [employer].     If you have lab work done today you will be contacted with your lab results within the next 2 weeks.  If you have not heard from [place] then please contact us. The fastest way to get your results is to register for My Chart.   IF you received an x-ray today, you will receive an invoice from [facility]. Please contact [facility] at [phone] with questions or concerns regarding your invoice.   IF you received labwork today, you will receive an invoice from [place]. Please contact [facility] at [phone] with questions or concerns regarding your invoice.   Our billing staff will not be able to assist you with questions regarding bills from these companies.  You will be contacted with the lab results as soon as they are available. The fastest way to get your results is to activate your My Chart account. Instructions are located on the last page of this paperwork. If you have not heard from [place]  regarding the results in 2 weeks, please contact this office.       Signed,   Meredith Staggers, MD [facility] at [facility].  03/28/18 1:21 PM

## 2018-04-11 ENCOUNTER — Other Ambulatory Visit: Payer: Self-pay

## 2018-04-11 ENCOUNTER — Ambulatory Visit (INDEPENDENT_AMBULATORY_CARE_PROVIDER_SITE_OTHER): Payer: Medicare Other | Admitting: Family Medicine

## 2018-04-11 ENCOUNTER — Encounter: Payer: Self-pay | Admitting: Family Medicine

## 2018-04-11 VITALS — BP 120/74 | HR 78 | Temp 98.5°F | Resp 20 | Ht 64.96 in | Wt 146.4 lb

## 2018-04-11 DIAGNOSIS — E7800 Pure hypercholesterolemia, unspecified: Secondary | ICD-10-CM | POA: Diagnosis not present

## 2018-04-11 DIAGNOSIS — R7303 Prediabetes: Secondary | ICD-10-CM | POA: Diagnosis not present

## 2018-04-11 DIAGNOSIS — I10 Essential (primary) hypertension: Secondary | ICD-10-CM | POA: Diagnosis not present

## 2018-04-11 DIAGNOSIS — Z1231 Encounter for screening mammogram for malignant neoplasm of breast: Secondary | ICD-10-CM | POA: Diagnosis not present

## 2018-04-11 DIAGNOSIS — M8580 Other specified disorders of bone density and structure, unspecified site: Secondary | ICD-10-CM

## 2018-04-11 DIAGNOSIS — R351 Nocturia: Secondary | ICD-10-CM

## 2018-04-11 DIAGNOSIS — Z0001 Encounter for general adult medical examination with abnormal findings: Secondary | ICD-10-CM | POA: Diagnosis not present

## 2018-04-11 DIAGNOSIS — Z0000 Encounter for general adult medical examination without abnormal findings: Secondary | ICD-10-CM

## 2018-04-11 MED ORDER — OXYBUTYNIN CHLORIDE 5 MG PO TABS: 10.0000 mg | ORAL_TABLET | Freq: Every day | ORAL | 3 refills | Status: DC

## 2018-04-11 MED ORDER — AZELASTINE HCL 0.1 % NA SOLN: 2.0000 | Freq: Two times a day (BID) | NASAL | 11 refills | Status: DC

## 2018-04-11 MED ORDER — LISINOPRIL-HYDROCHLOROTHIAZIDE 20-12.5 MG PO TABS: 2.0000 | ORAL_TABLET | Freq: Every day | ORAL | 1 refills | Status: DC

## 2018-04-11 MED ORDER — SIMVASTATIN 20 MG PO TABS: ORAL_TABLET | ORAL | 3 refills | Status: DC

## 2018-04-11 MED ORDER — AMLODIPINE BESYLATE 5 MG PO TABS: 5.0000 mg | ORAL_TABLET | Freq: Every day | ORAL | 1 refills | Status: DC

## 2018-04-11 NOTE — Progress Notes (Signed)
Presents today for [employer] Visit-Subsequent.   Date of last exam: dec 2018  Interpreter used for this visit? n/a  Patient Care Team: Shawnee Knapp, MD as PCP - General (Family Medicine)   Other items to address today: none   Cancer Screening: Cervical: n/a due to age Breast: June 2019 Colon: n/a due to age  Other Screening: Last screening for diabetes: prediabtes Last lipid screening: HLP Dexa: April 2018, osteopenia, low frax score  ADVANCE DIRECTIVES: Discussed: yes Patient desires CPR (Yes ), mechanical ventilation (Yes ), prolonged artificial support (may include mechanical ventilation, tube/PEG feeding, etc) (No ). On File: no Materials Provided: yes  Immunization status:  Immunization History  Administered Date(s) Administered  . Influenza Split 11/29/2011  . Influenza, High Dose Seasonal PF 12/11/2017  . Influenza,inj,Quad PF,6+ Mos 11/14/2012, 11/27/2013, 12/29/2014, 01/06/2016, 01/11/2017  . Pneumococcal Conjugate-13 09/24/2014  . Pneumococcal Polysaccharide-23 08/15/2012  . Td 03/30/1996  . Tdap 12/19/2012  . Zoster 02/12/2014     There are no preventive care reminders to display for this patient.  6CIT Screen 04/11/2018 01/18/2017  What Year? 0 points 0 points  What month? 0 points 0 points  What time? 0 points 0 points  Count back from 20 0 points 0 points  Months in reverse 0 points 0 points  Repeat phrase 0 points 2 points  Total Score 0 2    Functional Status Survey: Is the patient deaf or have difficulty hearing?: No Does the patient have difficulty seeing, even when wearing glasses/contacts?: No Does the patient have difficulty concentrating, remembering, or making decisions?: No Does the patient have difficulty walking or climbing stairs?: No Does the patient have difficulty dressing or bathing?: No Does the patient have difficulty doing errands alone such as visiting a doctor's office or shopping?: No   Home  Environment: lives with her grandson, still drives and work  Urinary Incontinence Screening: takes oxybutynin 40m at bedtime, however does not help, wakes up 3-4 times every night, takin 141mhelps more  Patient Active Problem List   Diagnosis Date Noted  . Prediabetes 01/28/2017  . Bilateral low back pain without sciatica 10/05/2015  . Diarrhea 09/24/2014  . Overweight (BMI 25.0-29.9) 09/04/2012  . S/P left THA, AA 09/03/2012  . High cholesterol 03/26/2011  . Hypertension 03/26/2011  . Asthma 03/26/2011     Past Medical History:  Diagnosis Date  . Allergy   . Arthritis   . Asthma   . Cataract    bilateral  . Hypercholesteremia   . Hypertension   . Osteopenia 05/04/2016   T scare -1.2 at femur  . Pneumonia    hx of     Past Surgical History:  Procedure Laterality Date  . ABDOMINAL HYSTERECTOMY  Feb 1976   Pelvic pain (no cancer)  . FRACTURE SURGERY Right 20 years ago   two fingers  . TOTAL HIP ARTHROPLASTY Left 09/03/2012   Procedure: LEFT TOTAL HIP ARTHROPLASTY ANTERIOR APPROACH;  Surgeon: MaMauri PoleMD;  Location: [facility];  Service: Orthopedics;  Laterality: Left;     Family History  Problem Relation Age of Onset  . Hypertension Mother   . Hypertension Maternal Grandmother   . Hypertension Brother   . Obesity Brother      Social History   Socioeconomic History  . Marital status: Widowed    Spouse name: n/a  . Number of children: 3  . Years of education: 11th grade  . Highest education level: 11th grade  Occupational History  . Occupation: Curator: [employer]  Social Needs  . Financial resource strain: Not hard at all  . Food insecurity:    Worry: Never true    Inability: Never true  . Transportation needs:    Medical: No    Non-medical: No  Tobacco Use  . Smoking status: Never Smoker  . Smokeless tobacco: Never Used  Substance and Sexual Activity  . Alcohol use: No  . Drug use: No  . Sexual activity: Not on  file  Lifestyle  . Physical activity:    Days per week: 0 days    Minutes per session: 0 min  . Stress: Not at all  Relationships  . Social connections:    Talks on phone: Once a week    Gets together: Once a week    Attends religious service: More than 4 times per year    Active member of club or organization: No    Attends meetings of clubs or organizations: Never    Relationship status: Widowed  . Intimate partner violence:    Fear of current or ex partner: No    Emotionally abused: No    Physically abused: No    Forced sexual activity: No  Other Topics Concern  . Not on file  Social History Narrative   Does not exercise.    Grandson lives with her.   Her great grandsons stay with her on the weekends.   Has a very strong Faith.     No Known Allergies   Prior to Admission medications   Medication Sig Start Date End Date Taking? Authorizing Provider  amLODipine (NORVASC) 5 MG tablet TAKE 1 TABLET BY MOUTH EVERY DAY 03/18/18  Yes Shawnee Knapp, MD  aspirin 81 MG tablet Take 81 mg by mouth daily.   Yes [provider]  Azelastine HCl 0.15 % SOLN Sig 1 spray in each nostril twice daily for the next 5 days. 03/15/18  Yes Horald Pollen, MD  lisinopril-hydrochlorothiazide (PRINZIDE,ZESTORETIC) 20-12.5 MG tablet TAKE 2 TABLETS BY MOUTH EVERY DAY 03/18/18  Yes Shawnee Knapp, MD  loratadine (CLARITIN) 10 MG tablet Take 10 mg by mouth daily.   Yes [provider]  OVER THE COUNTER MEDICATION OTC PreserVision eye vitamin and mineral supplement taking one in the am and one at night   Yes [provider]  PROAIR HFA 108 (90 Base) MCG/ACT inhaler TAKE 2 PUFFS BY MOUTH EVERY 6 HOURS AS NEEDED FOR WHEEZE OR SHORTNESS OF BREATH 02/11/18  Yes Shawnee Knapp, MD  Probiotic Product ([employer] PROBIOTIC MAXIMUM STRENGTH) CAPS Take 1 capsule by mouth daily. 04/02/14  Yes Barton Fanny, MD  simvastatin (ZOCOR) 20 MG tablet TAKE 1 TABLET BY MOUTH EVERY DAY IN THE EVENING  03/18/18  Yes Shawnee Knapp, MD     Depression screen [facility] 2/9 04/11/2018 03/28/2018 03/15/2018 01/26/2018 10/25/2017  Decreased Interest 0 0 0 0 0  Down, Depressed, Hopeless 0 0 0 0 0  PHQ - 2 Score 0 0 0 0 0     Fall Risk  04/11/2018 03/28/2018 03/15/2018 01/26/2018 10/25/2017  Falls in the past year? 0 0 0 0 No  Number falls in past yr: 0 0 0 - -  Injury with Fall? 0 0 0 - -  Follow up Falls evaluation completed Falls evaluation completed Falls evaluation completed - -   Review of Systems  Constitutional: Negative for chills and fever.  Respiratory:  Negative for cough and shortness of breath.   Cardiovascular: Negative for chest pain, palpitations and leg swelling.  Gastrointestinal: Negative for abdominal pain, nausea and vomiting.  Genitourinary: Positive for frequency and urgency. Negative for dysuria and hematuria.  All other systems reviewed and are negative.    PHYSICAL EXAM: BP 120/74   Pulse 78   Temp 98.5 F (36.9 C) (Oral)   Resp 20   Ht 5' 4.96" (1.65 m)   Wt 146 lb 6.4 oz (66.4 kg)   SpO2 97%   BMI 24.39 kg/m    Wt Readings from Last 3 Encounters:  04/11/18 146 lb 6.4 oz (66.4 kg)  03/28/18 148 lb 9.6 oz (67.4 kg)  03/15/18 148 lb 3.2 oz (67.2 kg)      Visual Acuity Screening   Right eye Left eye Both eyes  Without correction: 20/40 20/40 20/40  With correction:       Physical Exam  Constitutional: She is oriented to person, place, and time and well-developed, well-nourished, and in no distress.  HENT:  Head: Normocephalic and atraumatic.  Right Ear: Hearing, tympanic membrane, external ear and ear canal normal.  Left Ear: Hearing, tympanic membrane, external ear and ear canal normal.  Mouth/Throat: Oropharynx is clear and moist.  Eyes: Pupils are equal, round, and reactive to light. EOM are normal.  Neck: Neck supple. No thyromegaly present.  Cardiovascular: Normal rate, regular rhythm, normal heart sounds and intact distal pulses. Exam reveals no  gallop and no friction rub.  No murmur heard. Pulmonary/Chest: Effort normal and breath sounds normal. She has no wheezes. She has no rales.  Abdominal: Soft. Bowel sounds are normal. She exhibits no distension and no mass. There is no abdominal tenderness.  Musculoskeletal: Normal range of motion.        General: No edema.  Lymphadenopathy:    She has no cervical adenopathy.  Neurological: She is alert and oriented to person, place, and time. She has normal reflexes. No cranial nerve deficit. Gait normal.  Skin: Skin is warm and dry.  Psychiatric: Mood and affect normal.  Nursing note and vitals reviewed.   Education/Counseling provided regarding diet and exercise, prevention of chronic diseases, smoking/tobacco cessation, if applicable, and reviewed "Covered Medicare Preventive Services."   ASSESSMENT/PLAN: 1. Encounter for Medicare annual wellness exam Routine HCM labs ordered. HCM reviewed/discussed. Anticipatory guidance regarding healthy weight, lifestyle and choices given.   2. Essential hypertension Controlled. Continue current regime.  - CMP14+EGFR - TSH - Lipid panel - amLODipine (NORVASC) 5 MG tablet; Take 1 tablet (5 mg total) by mouth daily.  3. High cholesterol Checking labs today, medications will be adjusted as needed.  - CMP14+EGFR - TSH - Lipid panel - simvastatin (ZOCOR) 20 MG tablet; TAKE 1 TABLET BY MOUTH EVERY DAY IN THE EVENING  4. Prediabetes Checking labs today - Hemoglobin A1c  5. Visit for screening mammogram - MM DIGITAL SCREENING BILATERAL; Future  6. Osteopenia, unspecified location Reviewed LFM - DG Bone Density; Future  7. Nocturia Not well controlled. Increasing oxybutynin to 56m qhs. Reviewed r/se/b  Other orders - azelastine (ASTELIN) 0.1 % nasal spray; Place 2 sprays into both nostrils 2 (two) times daily. Use in each nostril as directed - lisinopril-hydrochlorothiazide (PRINZIDE,ZESTORETIC) 20-12.5 MG tablet; Take 2 tablets by  mouth daily. - oxybutynin (DITROPAN) 5 MG tablet; Take 2 tablets (10 mg total) by mouth at bedtime.

## 2018-04-11 NOTE — Patient Instructions (Signed)
° ° ° °  If you have lab work done today you will be contacted with your lab results within the next 2 weeks.  If you have not heard from us then please contact us. The fastest way to get your results is to register for My Chart. ° ° °IF you received an x-ray today, you will receive an invoice from [facility]. Please contact [facility] at [phone] with questions or concerns regarding your invoice.  ° °IF you received labwork today, you will receive an invoice from [facility]. Please contact [facility] at [phone] with questions or concerns regarding your invoice.  ° °Our billing staff will not be able to assist you with questions regarding bills from these companies. ° °You will be contacted with the lab results as soon as they are available. The fastest way to get your results is to activate your My Chart account. Instructions are located on the last page of this paperwork. If you have not heard from us regarding the results in 2 weeks, please contact this office. °  ° ° ° °

## 2018-04-12 LAB — HEMOGLOBIN A1C
Est. average glucose Bld gHb Est-mCnc: 123 mg/dL
Hgb A1c MFr Bld: 5.9 % — ABNORMAL HIGH (ref 4.8–5.6)

## 2018-04-12 LAB — CMP14+EGFR
ALT: 12 IU/L (ref 0–32)
AST: 18 IU/L (ref 0–40)
Albumin/Globulin Ratio: 1.8 (ref 1.2–2.2)
Albumin: 4.1 g/dL (ref 3.7–4.7)
Alkaline Phosphatase: 78 IU/L (ref 39–117)
BUN/Creatinine Ratio: 17 (ref 12–28)
BUN: 24 mg/dL (ref 8–27)
Bilirubin Total: 0.2 mg/dL (ref 0.0–1.2)
CO2: 19 mmol/L — ABNORMAL LOW (ref 20–29)
Calcium: 9.2 mg/dL (ref 8.7–10.3)
Chloride: 106 mmol/L (ref 96–106)
Creatinine, Ser: 1.41 mg/dL — ABNORMAL HIGH (ref 0.57–1.00)
GFR calc Af Amer: 41 mL/min/{1.73_m2} — ABNORMAL LOW (ref >59)
GFR calc non Af Amer: 36 mL/min/{1.73_m2} — ABNORMAL LOW (ref >59)
Globulin, Total: 2.3 g/dL (ref 1.5–4.5)
Glucose: 97 mg/dL (ref 65–99)
Potassium: 4.6 mmol/L (ref 3.5–5.2)
Sodium: 143 mmol/L (ref 134–144)
Total Protein: 6.4 g/dL (ref 6.0–8.5)

## 2018-04-12 LAB — MICROALBUMIN / CREATININE URINE RATIO
Creatinine, Urine: 70 mg/dL
Microalb/Creat Ratio: 36 mg/g creat — ABNORMAL HIGH (ref 0–29)
Microalbumin, Urine: 25.1 ug/mL

## 2018-04-12 LAB — TSH: TSH: 1.38 u[IU]/mL (ref 0.450–4.500)

## 2018-04-12 LAB — LIPID PANEL
Chol/HDL Ratio: 3.3 ratio (ref 0.0–4.4)
Cholesterol, Total: 144 mg/dL (ref 100–199)
HDL: 43 mg/dL (ref >39)
LDL Calculated: 79 mg/dL (ref 0–99)
Triglycerides: 112 mg/dL (ref 0–149)
VLDL Cholesterol Cal: 22 mg/dL (ref 5–40)

## 2018-04-24 ENCOUNTER — Encounter: Payer: Self-pay | Admitting: Radiology

## 2018-08-01 ENCOUNTER — Other Ambulatory Visit: Payer: Self-pay

## 2018-08-01 ENCOUNTER — Ambulatory Visit
Admission: RE | Admit: 2018-08-01 | Discharge: 2018-08-01 | Disposition: A | Discharge status: Home / Self Care | Payer: Medicare Other | Source: Ambulatory Visit | Attending: Family Medicine | Admitting: Family Medicine

## 2018-08-01 DIAGNOSIS — M8580 Other specified disorders of bone density and structure, unspecified site: Secondary | ICD-10-CM

## 2018-08-01 DIAGNOSIS — Z1231 Encounter for screening mammogram for malignant neoplasm of breast: Secondary | ICD-10-CM | POA: Diagnosis not present

## 2018-08-01 DIAGNOSIS — Z780 Asymptomatic menopausal state: Secondary | ICD-10-CM | POA: Diagnosis not present

## 2018-08-01 DIAGNOSIS — M85851 Other specified disorders of bone density and structure, right thigh: Secondary | ICD-10-CM | POA: Diagnosis not present

## 2018-10-10 ENCOUNTER — Ambulatory Visit (INDEPENDENT_AMBULATORY_CARE_PROVIDER_SITE_OTHER): Payer: Medicare Other | Admitting: Family Medicine

## 2018-10-10 ENCOUNTER — Encounter: Payer: Self-pay | Admitting: Family Medicine

## 2018-10-10 ENCOUNTER — Other Ambulatory Visit: Payer: Self-pay | Admitting: Family Medicine

## 2018-10-10 ENCOUNTER — Other Ambulatory Visit: Payer: Self-pay

## 2018-10-10 VITALS — BP 142/80 | HR 65 | Temp 98.3°F | Ht 64.96 in | Wt 155.0 lb

## 2018-10-10 DIAGNOSIS — M8580 Other specified disorders of bone density and structure, unspecified site: Secondary | ICD-10-CM

## 2018-10-10 DIAGNOSIS — E7800 Pure hypercholesterolemia, unspecified: Secondary | ICD-10-CM | POA: Diagnosis not present

## 2018-10-10 DIAGNOSIS — Z23 Encounter for immunization: Secondary | ICD-10-CM

## 2018-10-10 DIAGNOSIS — I10 Essential (primary) hypertension: Secondary | ICD-10-CM | POA: Diagnosis not present

## 2018-10-10 DIAGNOSIS — J4520 Mild intermittent asthma, uncomplicated: Secondary | ICD-10-CM | POA: Diagnosis not present

## 2018-10-10 DIAGNOSIS — R7303 Prediabetes: Secondary | ICD-10-CM | POA: Diagnosis not present

## 2018-10-10 MED ORDER — AMLODIPINE BESYLATE 5 MG PO TABS: 5.0000 mg | ORAL_TABLET | Freq: Every day | ORAL | 1 refills | Status: DC

## 2018-10-10 MED ORDER — SIMVASTATIN 20 MG PO TABS: ORAL_TABLET | ORAL | 3 refills | Status: DC

## 2018-10-10 NOTE — Telephone Encounter (Signed)
Forwarding medication refill request to the clinical pool for review. 

## 2018-10-10 NOTE — Progress Notes (Signed)
9/10/202011:10 AM  Kathryn Gomez 1940/03/25, 78 y.o., female [id number]  Chief Complaint  Patient presents with  . Hypertension    HPI:   Patient is a 78 y.o. female with past medical history significant for HTN, asthma, HLP, prediabetes, UI, osteopenia who presents today for routine followup  Last OV March 2020 - no changes dexa July 2020 - osteopenia, low frax score,, takes ca/d3 Checks BP at home, < 140/90 Did take her BP meds today Asthma mostly triggered by weather changes, uses albuterol prn, not very often Oxybutynin not working, nocturia x 4, she does not want to take anything for it, able to go back to sleep wo issues In jan 2021 planning to cut back to 4 hours of work a day  Lab Results  Component Value Date   HGBA1C 5.9 (H) 04/11/2018   HGBA1C 5.8 (H) 01/11/2017   HGBA1C 5.4 01/06/2016   Lab Results  Component Value Date   LDLCALC 79 04/11/2018   CREATININE 1.41 (H) 04/11/2018    Depression screen PHQ 2/9 10/10/2018 04/11/2018 03/28/2018  Decreased Interest 0 0 0  Down, Depressed, Hopeless 0 0 0  PHQ - 2 Score 0 0 0    Fall Risk  10/10/2018 04/11/2018 03/28/2018 03/15/2018 01/26/2018  Falls in the past year? 0 0 0 0 0  Number falls in past yr: 0 0 0 0 -  Injury with Fall? 0 0 0 0 -  Risk for fall due to : Orthopedic patient - - - -  Follow up - Falls evaluation completed Falls evaluation completed Falls evaluation completed -     No Known Allergies  Prior to Admission medications   Medication Sig Start Date End Date Taking? Authorizing Provider  amLODipine (NORVASC) 5 MG tablet Take 1 tablet (5 mg total) by mouth daily. 04/11/18  Yes Rutherford Guys, MD  aspirin 81 MG tablet Take 81 mg by mouth daily.   Yes [provider]  azelastine (ASTELIN) 0.1 % nasal spray Place 2 sprays into both nostrils 2 (two) times daily. Use in each nostril as directed 04/11/18  Yes Rutherford Guys, MD  lisinopril-hydrochlorothiazide (ZESTORETIC) 20-12.5 MG tablet  TAKE 2 TABLETS BY MOUTH EVERY DAY 10/10/18  Yes Rutherford Guys, MD  loratadine (CLARITIN) 10 MG tablet Take 10 mg by mouth daily.   Yes [provider]  OVER THE COUNTER MEDICATION OTC PreserVision eye vitamin and mineral supplement taking one in the am and one at night   Yes [provider]  oxybutynin (DITROPAN) 5 MG tablet Take 2 tablets (10 mg total) by mouth at bedtime. 04/11/18  Yes Rutherford Guys, MD  PROAIR HFA 108 [phone] Base) MCG/ACT inhaler TAKE 2 PUFFS BY MOUTH EVERY 6 HOURS AS NEEDED FOR WHEEZE OR SHORTNESS OF BREATH 02/11/18  Yes Shawnee Knapp, MD  Probiotic Product ([employer] PROBIOTIC MAXIMUM STRENGTH) CAPS Take 1 capsule by mouth daily. 04/02/14  Yes Barton Fanny, MD  simvastatin (ZOCOR) 20 MG tablet TAKE 1 TABLET BY MOUTH EVERY DAY IN THE EVENING 04/11/18  Yes Rutherford Guys, MD    Past Medical History:  Diagnosis Date  . Allergy   . Arthritis   . Asthma   . Cataract    bilateral  . Hypercholesteremia   . Hypertension   . Osteopenia 05/04/2016   T scare -1.2 at femur  . Pneumonia    hx of    Past Surgical History:  Procedure Laterality Date  . ABDOMINAL HYSTERECTOMY  Feb 1976   Pelvic pain (no cancer)  . FRACTURE SURGERY Right 20 years ago   two fingers  . TOTAL HIP ARTHROPLASTY Left 09/03/2012   Procedure: LEFT TOTAL HIP ARTHROPLASTY ANTERIOR APPROACH;  Surgeon: Mauri Pole, MD;  Location: [facility];  Service: Orthopedics;  Laterality: Left;    Social History   Tobacco Use  . Smoking status: Never Smoker  . Smokeless tobacco: Never Used  Substance Use Topics  . Alcohol use: No    Family History  Problem Relation Age of Onset  . Hypertension Mother   . Hypertension Maternal Grandmother   . Hypertension Brother   . Obesity Brother     Review of Systems  Constitutional: Negative for chills and fever.  Respiratory: Negative for cough and shortness of breath.   Cardiovascular: Negative for chest pain, palpitations and leg swelling.   Gastrointestinal: Negative for abdominal pain, nausea and vomiting.     OBJECTIVE:  Today's Vitals   10/10/18 1104 10/10/18 1135  BP: (!) 143/83 (!) 142/80  Pulse: 65   Temp: 98.3 F (36.8 C)   SpO2: 99%   Weight: 155 lb (70.3 kg)   Height: 5' 4.96" (1.65 m)    Body mass index is 25.83 kg/m.  BP Readings from Last 3 Encounters:  10/10/18 (!) 143/83  04/11/18 120/74  03/28/18 (!) 147/77    Physical Exam Vitals signs and nursing note reviewed.  Constitutional:      Appearance: She is well-developed.  HENT:     Head: Normocephalic and atraumatic.     Mouth/Throat:     Pharynx: No oropharyngeal exudate.  Eyes:     General: No scleral icterus.    Conjunctiva/sclera: Conjunctivae normal.     Pupils: Pupils are equal, round, and reactive to light.  Neck:     Musculoskeletal: Neck supple.  Cardiovascular:     Rate and Rhythm: Normal rate and regular rhythm.     Heart sounds: Normal heart sounds. No murmur. No friction rub. No gallop.   Pulmonary:     Effort: Pulmonary effort is normal.     Breath sounds: Normal breath sounds. No wheezing or rales.  Skin:    General: Skin is warm and dry.  Neurological:     Mental Status: She is alert and oriented to person, place, and time.     No results found for this or any previous visit (from the past 24 hour(s)).  No results found.   ASSESSMENT and PLAN  1. Essential hypertension Above goal today, normal readings at home, recheck bp nurse visit 2 weeks with BP cuff from home. meds to be adjusted as needed. - amLODipine (NORVASC) 5 MG tablet; Take 1 tablet (5 mg total) by mouth daily. - CMP14+EGFR - Care order/instruction:  2. Pure hypercholesterolemia Controlled. Continue current regime.  - simvastatin (ZOCOR) 20 MG tablet; TAKE 1 TABLET BY MOUTH EVERY DAY IN THE EVENING - CMP14+EGFR - Lipid panel  3. Prediabetes Checking labs today, medications to be started as needed.  - Hemoglobin A1c  4. Mild intermittent  asthma without complication Controlled. Continue current regime.   5. Osteopenia, unspecified location Stable. Continue current regime.   6. Need for prophylactic vaccination and inoculation against influenza - Flu Vaccine QUAD High Dose(Fluad)  Return in about 6 months (around 04/09/2019).    Rutherford Guys, MD [facility] at [facility] [place], [place] [address] Ph.  [phone] Fax [phone]

## 2018-10-11 LAB — CMP14+EGFR
ALT: 8 IU/L (ref 0–32)
AST: 12 IU/L (ref 0–40)
Albumin/Globulin Ratio: 1.6 (ref 1.2–2.2)
Albumin: 4.2 g/dL (ref 3.7–4.7)
Alkaline Phosphatase: 88 IU/L (ref 39–117)
BUN/Creatinine Ratio: 21 (ref 12–28)
BUN: 23 mg/dL (ref 8–27)
Bilirubin Total: <0.2 mg/dL (ref 0.0–1.2)
CO2: 21 mmol/L (ref 20–29)
Calcium: 9.5 mg/dL (ref 8.7–10.3)
Chloride: 106 mmol/L (ref 96–106)
Creatinine, Ser: 1.09 mg/dL — ABNORMAL HIGH (ref 0.57–1.00)
GFR calc Af Amer: 56 mL/min/1.73 — ABNORMAL LOW (ref >59)
GFR calc non Af Amer: 49 mL/min/1.73 — ABNORMAL LOW (ref >59)
Globulin, Total: 2.7 g/dL (ref 1.5–4.5)
Glucose: 115 mg/dL — ABNORMAL HIGH (ref 65–99)
Potassium: 4.8 mmol/L (ref 3.5–5.2)
Sodium: 139 mmol/L (ref 134–144)
Total Protein: 6.9 g/dL (ref 6.0–8.5)

## 2018-10-11 LAB — LIPID PANEL
Chol/HDL Ratio: 2.9 ratio (ref 0.0–4.4)
Cholesterol, Total: 137 mg/dL (ref 100–199)
HDL: 48 mg/dL (ref >39)
LDL Chol Calc (NIH): 71 mg/dL (ref 0–99)
Triglycerides: 97 mg/dL (ref 0–149)
VLDL Cholesterol Cal: 18 mg/dL (ref 5–40)

## 2018-10-11 LAB — HEMOGLOBIN A1C
Est. average glucose Bld gHb Est-mCnc: 117 mg/dL
Hgb A1c MFr Bld: 5.7 % — ABNORMAL HIGH (ref 4.8–5.6)

## 2018-10-21 ENCOUNTER — Encounter: Payer: Self-pay | Admitting: Radiology

## 2018-10-24 ENCOUNTER — Other Ambulatory Visit: Payer: Self-pay

## 2018-10-24 ENCOUNTER — Ambulatory Visit (INDEPENDENT_AMBULATORY_CARE_PROVIDER_SITE_OTHER): Payer: Medicare Other | Admitting: Family Medicine

## 2018-10-24 VITALS — BP 140/70

## 2018-10-24 DIAGNOSIS — I10 Essential (primary) hypertension: Secondary | ICD-10-CM

## 2018-10-24 NOTE — Progress Notes (Signed)
Nurse visit only.  Pt did not see provider at this visit.   Pt brought BP monitor from home however unit is broken.  Manual BP of RUE taken at 140/70.  Pt states she feels BP is a little higher than normal d/t stress.

## 2018-11-09 ENCOUNTER — Other Ambulatory Visit: Payer: Self-pay | Admitting: Family Medicine

## 2018-11-09 NOTE — Telephone Encounter (Signed)
Forwarding medication refill request to the clinical pool for review. 

## 2018-12-04 ENCOUNTER — Other Ambulatory Visit: Payer: Self-pay | Admitting: Family Medicine

## 2018-12-27 ENCOUNTER — Other Ambulatory Visit: Payer: Self-pay | Admitting: Family Medicine

## 2019-01-26 ENCOUNTER — Other Ambulatory Visit: Payer: Self-pay | Admitting: Family Medicine

## 2019-01-30 ENCOUNTER — Telehealth: Payer: Self-pay | Admitting: Family Medicine

## 2019-01-30 ENCOUNTER — Ambulatory Visit: Payer: Self-pay

## 2019-01-30 NOTE — Telephone Encounter (Signed)
Kathryn Gomez called she is a level 4 risk . And was triaged with nurse at [facility] she was exsposed  to a relative that tested POS for Covid but has no symptoms  She wanted to let you know that she is going to be tested and will c/b and do virtual follow up if needed FR

## 2019-01-30 NOTE — Telephone Encounter (Signed)
Returned call to patient who states that she has had a positive exposure to a family member who is positive for COVID-19.  She states her grandson visited over the Christmas weekend and has now tested positive.  She states that she had minimal contact with him while he was visiting but did pose for a picture with him they were not wearing mask at this time. She denies sharing time at meals. She states he/she kept to themselves.  She reports cough but states that this cough is normal for her. Care advice read to patient.  She will quarantine 14 days post exposure. Test site information given to patient. She verbalized understanding. Patient call transferred to office for follow up.  Reason for Disposition . [1] CLOSE CONTACT COVID-19 EXPOSURE within last 14 days AND [2] NO symptoms  Answer Assessment - Initial Assessment Questions 1. COVID-19 CLOSE CONTACT: "Who is the person with the confirmed or suspected COVID-19 infection that you were exposed to?"     Grandson Christmas day 2. PLACE of CONTACT: "Where were you when you were exposed to COVID-19?" (e.g., home, school, medical waiting room; which city?)    Home spent night 3. TYPE of CONTACT: "How much contact was there?" (e.g., sitting next to, live in same house, work in same office, same building)     Same home 24 hours 4. DURATION of CONTACT: "How long were you in contact with the COVID-19 patient?" (e.g., a few seconds, passed by person, a few minutes, 15 minutes or longer, live with the patient)    1 minute picture 5. MASK: "Were you wearing a mask?" "Was the other person wearing a mask?" Note: wearing a mask reduces the risk of an  otherwise close contact.   No wearing mask with picture 6. DATE of CONTACT: "When did you have contact with a COVID-19 patient?" (e.g., how many days ago)   Dec 25 7. COMMUNITY SPREAD: "Are there lots of cases of COVID-19 (community spread) where you live?" (See public health department website, if unsure)    [place] 8. SYMPTOMS: "Do you have any symptoms?" (e.g., fever, cough, breathing difficulty, loss of taste or smell)    Cough but normal 9. PREGNANCY OR POSTPARTUM: "Is there any chance you are pregnant?" "When was your last menstrual period?" "Did you deliver in the last 2 weeks?"    N/A 10. HIGH RISK: "Do you have any heart or lung problems? Do you have a weak immune system?" (e.g., heart failure, COPD, asthma, HIV positive, chemotherapy, renal failure, diabetes mellitus, sickle cell anemia, obesity)      no 11.  TRAVEL: "Have you traveled out of the country recently?" If so, "When and where?"  Also ask about out-of-state travel, since the CDC has identified some high-risk cities for community spread in the [place].  Note: Travel becomes less relevant if there is widespread community transmission where the patient lives.       N/A  Protocols used: CORONAVIRUS (COVID-19) EXPOSURE-A-AH

## 2019-02-03 ENCOUNTER — Ambulatory Visit: Payer: Medicare Other | Attending: Internal Medicine

## 2019-02-03 DIAGNOSIS — R238 Other skin changes: Secondary | ICD-10-CM

## 2019-02-03 DIAGNOSIS — U071 COVID-19: Secondary | ICD-10-CM | POA: Diagnosis not present

## 2019-02-04 LAB — NOVEL CORONAVIRUS, NAA: SARS-CoV-2, NAA: NOT DETECTED

## 2019-02-19 ENCOUNTER — Other Ambulatory Visit: Payer: Self-pay | Admitting: Family Medicine

## 2019-03-18 ENCOUNTER — Other Ambulatory Visit: Payer: Self-pay | Admitting: Family Medicine

## 2019-04-10 ENCOUNTER — Ambulatory Visit (INDEPENDENT_AMBULATORY_CARE_PROVIDER_SITE_OTHER): Payer: Medicare Other | Admitting: Family Medicine

## 2019-04-10 ENCOUNTER — Encounter: Payer: Self-pay | Admitting: Family Medicine

## 2019-04-10 ENCOUNTER — Other Ambulatory Visit: Payer: Self-pay

## 2019-04-10 ENCOUNTER — Other Ambulatory Visit: Payer: Self-pay | Admitting: Family Medicine

## 2019-04-10 VITALS — BP 132/74 | HR 78 | Temp 98.1°F | Ht 64.96 in | Wt 146.0 lb

## 2019-04-10 DIAGNOSIS — R7303 Prediabetes: Secondary | ICD-10-CM

## 2019-04-10 DIAGNOSIS — I10 Essential (primary) hypertension: Secondary | ICD-10-CM | POA: Diagnosis not present

## 2019-04-10 DIAGNOSIS — N3941 Urge incontinence: Secondary | ICD-10-CM | POA: Insufficient documentation

## 2019-04-10 DIAGNOSIS — E7800 Pure hypercholesterolemia, unspecified: Secondary | ICD-10-CM | POA: Diagnosis not present

## 2019-04-10 DIAGNOSIS — I700 Atherosclerosis of aorta: Secondary | ICD-10-CM | POA: Diagnosis not present

## 2019-04-10 DIAGNOSIS — J4520 Mild intermittent asthma, uncomplicated: Secondary | ICD-10-CM

## 2019-04-10 MED ORDER — FLUTICASONE PROPIONATE 50 MCG/ACT NA SUSP: 1.0000 | Freq: Two times a day (BID) | NASAL | 6 refills | Status: DC

## 2019-04-10 MED ORDER — OXYBUTYNIN CHLORIDE ER 5 MG PO TB24: 5.0000 mg | ORAL_TABLET | Freq: Every day | ORAL | 3 refills | Status: DC

## 2019-04-10 MED ORDER — ALBUTEROL SULFATE HFA 108 (90 BASE) MCG/ACT IN AERS: INHALATION_SPRAY | RESPIRATORY_TRACT | 3 refills | Status: DC

## 2019-04-10 MED ORDER — AMLODIPINE BESYLATE 5 MG PO TABS: 5.0000 mg | ORAL_TABLET | Freq: Every day | ORAL | 3 refills | Status: DC

## 2019-04-10 NOTE — Progress Notes (Signed)
3/11/202110:51 AM  Kathryn Gomez 1940/09/29, 79 y.o., female [id number]  Chief Complaint  Patient presents with  . Hypertension  . Medication Refill    amlodipine, oxybutynin    HPI:   Patient is a 79 y.o. female with past medical history significant for HTN, HLP, UI, mild intermittent asthma, prediabetes, osteopenia who presents today for routine followup  Last OV Sept 2020 - wanted to try off oxybutynin as felt it was not working really well for her dexa due July 2022 CT chest 2016 - artherosclerosis of thoracic aorta wo aneurysm and coronary artery calcifications  She reports that she overall doing well Taking all her meds as rx wo any issues She has been working on eating healthier and losing weight She continues to work full time and therefore resumed oxybutynin '5mg'$  once a day as it helps her not having to go to the restroom multiple times at work Needs a different nasal spary, azelastine is too expansive She uses albuterol prn, needs refill as her rx has expired, asthma triggered by cutting grass or really cold weather  Lab Results  Component Value Date   HGBA1C 5.7 (H) 10/10/2018   HGBA1C 5.9 (H) 04/11/2018   HGBA1C 5.8 (H) 01/11/2017   Lab Results  Component Value Date   LDLCALC 71 10/10/2018   CREATININE 1.09 (H) 10/10/2018    Depression screen PHQ 2/9 04/10/2019 10/10/2018 04/11/2018  Decreased Interest 0 0 0  Down, Depressed, Hopeless - 0 0  PHQ - 2 Score 0 0 0    Fall Risk  04/10/2019 10/10/2018 04/11/2018 03/28/2018 03/15/2018  Falls in the past year? 0 0 0 0 0  Number falls in past yr: 0 0 0 0 0  Injury with Fall? 0 0 0 0 0  Risk for fall due to : - Orthopedic patient - - -  Follow up - - Falls evaluation completed Falls evaluation completed Falls evaluation completed     No Known Allergies  Prior to Admission medications   Medication Sig Start Date End Date Taking? Authorizing Provider  amLODipine (NORVASC) 5 MG tablet Take 1 tablet (5 mg total) by  mouth daily. 10/10/18  Yes Rutherford Guys, MD  aspirin 81 MG tablet Take 81 mg by mouth daily.   Yes [provider]  azelastine (ASTELIN) 0.1 % nasal spray Place 2 sprays into both nostrils 2 (two) times daily. Use in each nostril as directed 04/11/18  Yes Rutherford Guys, MD  lisinopril-hydrochlorothiazide (ZESTORETIC) 20-12.5 MG tablet TAKE 2 TABLETS BY MOUTH EVERY DAY 03/18/19  Yes Rutherford Guys, MD  loratadine (CLARITIN) 10 MG tablet Take 10 mg by mouth daily.   Yes [provider]  OVER THE COUNTER MEDICATION OTC PreserVision eye vitamin and mineral supplement taking one in the am and one at night   Yes [provider]  PROAIR HFA 108 (90 Base) MCG/ACT inhaler TAKE 2 PUFFS BY MOUTH EVERY 6 HOURS AS NEEDED FOR WHEEZE OR SHORTNESS OF BREATH 02/11/18  Yes Shawnee Knapp, MD  Probiotic Product ([employer] PROBIOTIC MAXIMUM STRENGTH) CAPS Take 1 capsule by mouth daily. 04/02/14  Yes Barton Fanny, MD  simvastatin (ZOCOR) 20 MG tablet TAKE 1 TABLET BY MOUTH EVERY DAY IN THE EVENING 10/10/18  Yes Rutherford Guys, MD    Past Medical History:  Diagnosis Date  . Allergy   . Arthritis   . Asthma   . Cataract    bilateral  . Hypercholesteremia   . Hypertension   .  Osteopenia 05/04/2016   T scare -1.2 at femur  . Pneumonia    hx of    Past Surgical History:  Procedure Laterality Date  . ABDOMINAL HYSTERECTOMY  Feb 1976   Pelvic pain (no cancer)  . FRACTURE SURGERY Right 20 years ago   two fingers  . TOTAL HIP ARTHROPLASTY Left 09/03/2012   Procedure: LEFT TOTAL HIP ARTHROPLASTY ANTERIOR APPROACH;  Surgeon: Mauri Pole, MD;  Location: [facility];  Service: Orthopedics;  Laterality: Left;    Social History   Tobacco Use  . Smoking status: Never Smoker  . Smokeless tobacco: Never Used  Substance Use Topics  . Alcohol use: No    Family History  Problem Relation Age of Onset  . Hypertension Mother   . Hypertension Maternal Grandmother   . Hypertension Brother    . Obesity Brother     Review of Systems  Constitutional: Negative for chills and fever.  Respiratory: Negative for cough and shortness of breath.   Cardiovascular: Negative for chest pain, palpitations and leg swelling.  Gastrointestinal: Negative for abdominal pain, nausea and vomiting.     OBJECTIVE:  Today's Vitals   04/10/19 1035  BP: 132/74  Pulse: 78  Temp: 98.1 F (36.7 C)  SpO2: 100%  Weight: 146 lb (66.2 kg)  Height: 5' 4.96" (1.65 m)   Body mass index is 24.33 kg/m.  Wt Readings from Last 3 Encounters:  04/10/19 146 lb (66.2 kg)  10/10/18 155 lb (70.3 kg)  04/11/18 146 lb 6.4 oz (66.4 kg)    Physical Exam Vitals and nursing note reviewed.  Constitutional:      Appearance: She is well-developed.  HENT:     Head: Normocephalic and atraumatic.     Mouth/Throat:     Pharynx: No oropharyngeal exudate.  Eyes:     General: No scleral icterus.    Conjunctiva/sclera: Conjunctivae normal.     Pupils: Pupils are equal, round, and reactive to light.  Cardiovascular:     Rate and Rhythm: Normal rate and regular rhythm.     Heart sounds: Normal heart sounds. No murmur. No friction rub. No gallop.   Pulmonary:     Effort: Pulmonary effort is normal.     Breath sounds: Normal breath sounds. No wheezing, rhonchi or rales.  Musculoskeletal:     Cervical back: Neck supple.     Right lower leg: No edema.     Left lower leg: No edema.  Skin:    General: Skin is warm and dry.  Neurological:     Mental Status: She is alert and oriented to person, place, and time.  Psychiatric:        Mood and Affect: Mood normal.        Behavior: Behavior normal.     No results found for this or any previous visit (from the past 24 hour(s)).  No results found.   ASSESSMENT and PLAN  1. Essential hypertension Controlled. Continue current regime.  - CMP14+EGFR - Lipid panel - amLODipine (NORVASC) 5 MG tablet; Take 1 tablet (5 mg total) by mouth daily.  2. Pure  hypercholesterolemia 3. Arteriosclerosis of thoracic aorta ([place]) Patient on statin, BP controlled. Asymptomatic. Labs pending, meds to be adjusted as needed - CMP14+EGFR - Lipid panel  4. Prediabetes Labs pending, cont with LFM - Hemoglobin A1c  5. Urge urinary incontinence Resume oxybutynin, reviewed r/se/b  6. Mild intermittent asthma without complication Controlled. Continue current regime.  - albuterol (PROAIR HFA) 108 (90 Base) MCG/ACT inhaler;  TAKE 2 PUFFS BY MOUTH EVERY 6 HOURS AS NEEDED FOR WHEEZE OR SHORTNESS OF BREATH  Other orders - oxybutynin (DITROPAN-XL) 5 MG 24 hr tablet; Take 1 tablet (5 mg total) by mouth at bedtime. - fluticasone (FLONASE) 50 MCG/ACT nasal spray; Place 1 spray into both nostrils 2 (two) times daily.  Return in about 6 months (around 10/11/2019).    Rutherford Guys, MD Primary Care at [facility] [place], [place] [address] Ph.  [phone] Fax [phone]

## 2019-04-10 NOTE — Telephone Encounter (Signed)
Requested medication (s) are due for refill today:  Yes  Requested medication (s) are on the active medication list:  Yes  Future visit scheduled:  Has appt. Today with labs  Last Refill: 03/18/19 #60; no refills   Requested Prescriptions  Pending Prescriptions Disp Refills   lisinopril-hydrochlorothiazide (ZESTORETIC) 20-12.5 MG tablet [Pharmacy Med Name: LISINOPRIL-HCTZ 20-12.5 MG TAB] 60 tablet 0    Sig: TAKE 2 TABLETS BY MOUTH EVERY DAY      Cardiovascular:  ACEI + Diuretic Combos Failed - 04/10/2019  9:32 AM      Failed - Na in normal range and within 180 days    Sodium  Date Value Ref Range Status  10/10/2018 139 134 - 144 mmol/L Final          Failed - K in normal range and within 180 days    Potassium  Date Value Ref Range Status  10/10/2018 4.8 3.5 - 5.2 mmol/L Final          Failed - Cr in normal range and within 180 days    Creat  Date Value Ref Range Status  07/01/2015 1.05 (H) 0.60 - 0.93 mg/dL Final   Creatinine, Ser  Date Value Ref Range Status  10/10/2018 1.09 (H) 0.57 - 1.00 mg/dL Final          Failed - Ca in normal range and within 180 days    Calcium  Date Value Ref Range Status  10/10/2018 9.5 8.7 - 10.3 mg/dL Final          Failed - Last BP in normal range    BP Readings from Last 1 Encounters:  10/24/18 140/70          Failed - Valid encounter within last 6 months    Recent Outpatient Visits           5 months ago Essential hypertension   [facility], Meda Coffee, MD   6 months ago Essential hypertension   [facility], Meda Coffee, MD   12 months ago Encounter for [employer] annual wellness exam   [facility], Meda Coffee, MD   1 year ago Cough   [facility] at [facility], Asencion Partridge, MD   1 year ago Allergic rhinitis, unspecified seasonality, unspecified trigger   Primary Care at [facility], Eilleen Kempf, MD       Future Appointments             Today Myles Lipps, MD  Primary Care at [place], [facility]            Passed - Patient is not pregnant

## 2019-04-10 NOTE — Patient Instructions (Signed)
° ° ° °  If you have lab work done today you will be contacted with your lab results within the next 2 weeks.  If you have not heard from us then please contact us. The fastest way to get your results is to register for My Chart. ° ° °IF you received an x-ray today, you will receive an invoice from [facility]. Please contact [facility] at [phone] with questions or concerns regarding your invoice.  ° °IF you received labwork today, you will receive an invoice from [facility]. Please contact [facility] at [phone] with questions or concerns regarding your invoice.  ° °Our billing staff will not be able to assist you with questions regarding bills from these companies. ° °You will be contacted with the lab results as soon as they are available. The fastest way to get your results is to activate your My Chart account. Instructions are located on the last page of this paperwork. If you have not heard from us regarding the results in 2 weeks, please contact this office. °  ° ° ° °

## 2019-04-11 LAB — HEMOGLOBIN A1C
Est. average glucose Bld gHb Est-mCnc: 123 mg/dL
Hgb A1c MFr Bld: 5.9 % — ABNORMAL HIGH (ref 4.8–5.6)

## 2019-04-11 LAB — CMP14+EGFR
ALT: 10 IU/L (ref 0–32)
AST: 14 IU/L (ref 0–40)
Albumin/Globulin Ratio: 1.3 (ref 1.2–2.2)
Albumin: 4 g/dL (ref 3.7–4.7)
Alkaline Phosphatase: 84 IU/L (ref 39–117)
BUN/Creatinine Ratio: 21 (ref 12–28)
BUN: 22 mg/dL (ref 8–27)
Bilirubin Total: 0.2 mg/dL (ref 0.0–1.2)
CO2: 20 mmol/L (ref 20–29)
Calcium: 9.3 mg/dL (ref 8.7–10.3)
Chloride: 109 mmol/L — ABNORMAL HIGH (ref 96–106)
Creatinine, Ser: 1.04 mg/dL — ABNORMAL HIGH (ref 0.57–1.00)
GFR calc Af Amer: 59 mL/min/{1.73_m2} — ABNORMAL LOW (ref >59)
GFR calc non Af Amer: 51 mL/min/{1.73_m2} — ABNORMAL LOW (ref >59)
Globulin, Total: 3 g/dL (ref 1.5–4.5)
Glucose: 105 mg/dL — ABNORMAL HIGH (ref 65–99)
Potassium: 4.3 mmol/L (ref 3.5–5.2)
Sodium: 143 mmol/L (ref 134–144)
Total Protein: 7 g/dL (ref 6.0–8.5)

## 2019-04-11 LAB — LIPID PANEL
Chol/HDL Ratio: 3 ratio (ref 0.0–4.4)
Cholesterol, Total: 128 mg/dL (ref 100–199)
HDL: 42 mg/dL (ref >39)
LDL Chol Calc (NIH): 68 mg/dL (ref 0–99)
Triglycerides: 94 mg/dL (ref 0–149)
VLDL Cholesterol Cal: 18 mg/dL (ref 5–40)

## 2019-06-03 ENCOUNTER — Telehealth: Payer: Self-pay | Admitting: *Deleted

## 2019-06-03 NOTE — Telephone Encounter (Signed)
Schedule AWV.  

## 2019-07-03 ENCOUNTER — Other Ambulatory Visit: Payer: Self-pay | Admitting: Family Medicine

## 2019-07-03 DIAGNOSIS — Z1231 Encounter for screening mammogram for malignant neoplasm of breast: Secondary | ICD-10-CM

## 2019-07-22 ENCOUNTER — Telehealth: Payer: Self-pay | Admitting: *Deleted

## 2019-07-22 NOTE — Telephone Encounter (Signed)
Schedule AWV.  

## 2019-08-07 ENCOUNTER — Other Ambulatory Visit: Payer: Self-pay

## 2019-08-07 ENCOUNTER — Ambulatory Visit
Admission: RE | Admit: 2019-08-07 | Discharge: 2019-08-07 | Disposition: A | Discharge status: Home / Self Care | Payer: Medicare Other | Source: Ambulatory Visit | Attending: Family Medicine | Admitting: Family Medicine

## 2019-08-07 DIAGNOSIS — Z1231 Encounter for screening mammogram for malignant neoplasm of breast: Secondary | ICD-10-CM

## 2019-09-05 ENCOUNTER — Encounter: Payer: Self-pay | Admitting: Family Medicine

## 2019-09-05 ENCOUNTER — Ambulatory Visit (INDEPENDENT_AMBULATORY_CARE_PROVIDER_SITE_OTHER): Payer: Medicare Other | Admitting: Family Medicine

## 2019-09-05 ENCOUNTER — Ambulatory Visit (INDEPENDENT_AMBULATORY_CARE_PROVIDER_SITE_OTHER): Payer: Medicare Other

## 2019-09-05 ENCOUNTER — Other Ambulatory Visit: Payer: Self-pay

## 2019-09-05 VITALS — BP 96/60 | HR 85 | Temp 98.2°F | Resp 16 | Ht 64.0 in | Wt 143.8 lb

## 2019-09-05 DIAGNOSIS — M25562 Pain in left knee: Secondary | ICD-10-CM

## 2019-09-05 DIAGNOSIS — J4521 Mild intermittent asthma with (acute) exacerbation: Secondary | ICD-10-CM | POA: Diagnosis not present

## 2019-09-05 DIAGNOSIS — I83812 Varicose veins of left lower extremities with pain: Secondary | ICD-10-CM | POA: Diagnosis not present

## 2019-09-05 DIAGNOSIS — M7989 Other specified soft tissue disorders: Secondary | ICD-10-CM | POA: Diagnosis not present

## 2019-09-05 DIAGNOSIS — S8992XA Unspecified injury of left lower leg, initial encounter: Secondary | ICD-10-CM | POA: Diagnosis not present

## 2019-09-05 MED ORDER — PREDNISONE 20 MG PO TABS: 40.0000 mg | ORAL_TABLET | Freq: Every day | ORAL | 0 refills | Status: DC

## 2019-09-05 MED ORDER — ALBUTEROL SULFATE HFA 108 (90 BASE) MCG/ACT IN AERS: INHALATION_SPRAY | RESPIRATORY_TRACT | 5 refills | Status: AC

## 2019-09-05 NOTE — Progress Notes (Signed)
8/6/20212:48 PM  Kathryn Gomez 1941-01-11, 79 y.o., female [id number]  Chief Complaint  Patient presents with  . Asthma    pt has been having issues with struggling to get deep breaths when it is cold especially at work, daughter gave her an albuterol nebulizing treatment which helped, pt wonders if she needs more medication to stabalize asthma symptoms   . Leg Pain    pt has been having Lt knee/leg pain for the last month possibly longer, pt has been using creams to help but they do little to nothing, pt reports swelling in the knee after working, she works 8 hours on her feet. no known injury no falls   . Herpes Zoster    pt was advised to get a Shigles vaccine would like you to advise if she should and where to have this done (here or the Pharmacy)    HPI:   Patient is a 79 y.o. female with past medical history significant for HTN, HLP, UI, mild intermittent asthma, prediabetes, osteopenia who presents today with several concerns  Last OV march 2021  For past 2 weeks having chest tightness, SOB Feels like asthma is "acting up" She has been using her daughters albuterol neb which has helped a bit, last one earlier today  She has also been having left knee pain, stiffness and swelling for past several months  She denies any trauma She denies any giving away or locking of knee She denies any radiating pain, numbness or tingling She works standing 8 hours at a time  Checks BP at home  She reports usually in the 130/80s Denies any dizziness, fatigue, palpitations, chest pain  She is wondering about getting shingrix vaccine She had zostervac in 2016  Depression screen [facility] 2/9 09/05/2019 04/10/2019 10/10/2018  Decreased Interest 0 0 0  Down, Depressed, Hopeless 0 - 0  PHQ - 2 Score 0 0 0    Fall Risk  09/05/2019 04/10/2019 10/10/2018 04/11/2018 03/28/2018  Falls in the past year? 0 0 0 0 0  Number falls in past yr: 0 0 0 0 0  Injury with Fall? 0 0 0 0 0  Risk for fall due to : No  Fall Risks - Orthopedic patient - -  Follow up Falls evaluation completed - - Falls evaluation completed Falls evaluation completed     No Known Allergies  Prior to Admission medications   Medication Sig Start Date End Date Taking? Authorizing Provider  albuterol (PROAIR HFA) 108 (90 Base) MCG/ACT inhaler TAKE 2 PUFFS BY MOUTH EVERY 6 HOURS AS NEEDED FOR WHEEZE OR SHORTNESS OF BREATH 04/10/19  Yes Myles Lipps, MD  amLODipine (NORVASC) 5 MG tablet Take 1 tablet (5 mg total) by mouth daily. 04/10/19  Yes Myles Lipps, MD  aspirin 81 MG tablet Take 81 mg by mouth daily.   Yes [provider]  fluticasone (FLONASE) 50 MCG/ACT nasal spray Place 1 spray into both nostrils 2 (two) times daily. 04/10/19  Yes Myles Lipps, MD  lisinopril-hydrochlorothiazide (ZESTORETIC) 20-12.5 MG tablet TAKE 2 TABLETS BY MOUTH EVERY DAY 04/10/19  Yes Myles Lipps, MD  loratadine (CLARITIN) 10 MG tablet Take 10 mg by mouth daily.   Yes [provider]  OVER THE COUNTER MEDICATION OTC PreserVision eye vitamin and mineral supplement taking one in the am and one at night   Yes [provider]  oxybutynin (DITROPAN-XL) 5 MG 24 hr tablet Take 1 tablet (5 mg total) by mouth at bedtime.  04/10/19  Yes Myles Lipps, MD  Probiotic Product ([employer]S PROBIOTIC MAXIMUM STRENGTH) CAPS Take 1 capsule by mouth daily. 04/02/14  Yes Maurice March, MD  simvastatin (ZOCOR) 20 MG tablet TAKE 1 TABLET BY MOUTH EVERY DAY IN THE EVENING 10/10/18  Yes Myles Lipps, MD    Past Medical History:  Diagnosis Date  . Allergy   . Arthritis   . Asthma   . Cataract    bilateral  . Hypercholesteremia   . Hypertension   . Osteopenia 05/04/2016   T scare -1.2 at femur  . Pneumonia    hx of    Past Surgical History:  Procedure Laterality Date  . ABDOMINAL HYSTERECTOMY  Feb 1976   Pelvic pain (no cancer)  . FRACTURE SURGERY Right 20 years ago   two fingers  . TOTAL HIP ARTHROPLASTY Left  09/03/2012   Procedure: LEFT TOTAL HIP ARTHROPLASTY ANTERIOR APPROACH;  Surgeon: Shelda Pal, MD;  Location: [facility];  Service: Orthopedics;  Laterality: Left;    Social History   Tobacco Use  . Smoking status: Never Smoker  . Smokeless tobacco: Never Used  Substance Use Topics  . Alcohol use: No    Family History  Problem Relation Age of Onset  . Hypertension Mother   . Hypertension Maternal Grandmother   . Hypertension Brother   . Obesity Brother     ROS Per hpi  OBJECTIVE:  Today's Vitals   09/05/19 1405  BP: 96/60  Pulse: 85  Resp: 16  Temp: 98.2 F (36.8 C)  TempSrc: Temporal  SpO2: 98%  Weight: 143 lb 12.8 oz (65.2 kg)  Height: 5\' 4"  (1.626 m)   Body mass index is 24.68 kg/m.   BP Readings from Last 3 Encounters:  09/05/19 96/60  04/10/19 132/74  10/24/18 140/70   Wt Readings from Last 3 Encounters:  09/05/19 143 lb 12.8 oz (65.2 kg)  04/10/19 146 lb (66.2 kg)  10/10/18 155 lb (70.3 kg)    Peak flow reading is 270, below predicated.  Physical Exam Vitals and nursing note reviewed.  Constitutional:      Appearance: She is well-developed.  HENT:     Head: Normocephalic and atraumatic.     Mouth/Throat:     Pharynx: No oropharyngeal exudate.  Eyes:     General: No scleral icterus.    Extraocular Movements: Extraocular movements intact.     Conjunctiva/sclera: Conjunctivae normal.     Pupils: Pupils are equal, round, and reactive to light.  Cardiovascular:     Rate and Rhythm: Normal rate and regular rhythm.     Heart sounds: Normal heart sounds. No murmur heard.  No friction rub. No gallop.   Pulmonary:     Effort: Pulmonary effort is normal. No respiratory distress.     Breath sounds: Wheezing present. No rhonchi or rales.  Musculoskeletal:     Cervical back: Neck supple.     Left knee: Swelling (medial aspect, cluster of varicose veins) present. No bony tenderness. Normal range of motion. No tenderness.     Instability Tests: Anterior  drawer test negative. Posterior drawer test negative. Anterior Lachman test negative. Medial McMurray test negative and lateral McMurray test negative.     Right lower leg: No edema.     Left lower leg: No edema.  Skin:    General: Skin is warm and dry.  Neurological:     Mental Status: She is alert and oriented to person, place, and time.  No results found for this or any previous visit (from the past 24 hour(s)).  DG Knee Complete 4 Views Left  Result Date: 09/05/2019 CLINICAL DATA:  Left knee pain. EXAM: LEFT KNEE - COMPLETE 4+ VIEW COMPARISON:  None. FINDINGS: No evidence of fracture, dislocation, or joint effusion. No evidence of arthropathy or other focal bone abnormality. There is mild to moderate severity medial soft tissue swelling. IMPRESSION: Medial soft tissue swelling which may be, in part, secondary to the patient's body habitus. Electronically Signed   By: Aram Candela M.D.   On: 09/05/2019 15:13     ASSESSMENT and PLAN  1. Mild intermittent asthma with acute exacerbation Discussed supportive measures, new meds r/se/b and RTC precautions. Work excuse given until re-eval.  - albuterol (PROAIR HFA) 108 (90 Base) MCG/ACT inhaler; TAKE 2 PUFFS BY MOUTH EVERY 6 HOURS AS NEEDED FOR WHEEZE OR SHORTNESS OF BREATH  2. Acute pain of left knee 3. Varicose veins of left lower extremity with pain Actual knee exam normal. Pain seems to be 2/2 varicose veins, discussed supportive measures. Consider referral to vasc surg.  - DG Knee Complete 4 Views Left    Other orders - predniSONE (DELTASONE) 20 MG tablet; Take 2 tablets (40 mg total) by mouth daily with breakfast.  Return in about 1 week (around 09/12/2019) for asthma and knee pain.    Myles Lipps, MD Primary Care at [facility] [address] [place], [place] [address] Ph.  [phone] Fax [phone]

## 2019-09-05 NOTE — Patient Instructions (Signed)
° ° ° °  If you have lab work done today you will be contacted with your lab results within the next 2 weeks.  If you have not heard from us then please contact us. The fastest way to get your results is to register for My Chart. ° ° °IF you received an x-ray today, you will receive an invoice from [facility]. Please contact [facility] at [phone] with questions or concerns regarding your invoice.  ° °IF you received labwork today, you will receive an invoice from [facility]. Please contact [facility] at [phone] with questions or concerns regarding your invoice.  ° °Our billing staff will not be able to assist you with questions regarding bills from these companies. ° °You will be contacted with the lab results as soon as they are available. The fastest way to get your results is to activate your My Chart account. Instructions are located on the last page of this paperwork. If you have not heard from us regarding the results in 2 weeks, please contact this office. °  ° ° ° °

## 2019-09-11 ENCOUNTER — Ambulatory Visit (INDEPENDENT_AMBULATORY_CARE_PROVIDER_SITE_OTHER): Payer: Medicare Other | Admitting: Family Medicine

## 2019-09-11 ENCOUNTER — Encounter: Payer: Self-pay | Admitting: Family Medicine

## 2019-09-11 ENCOUNTER — Other Ambulatory Visit: Payer: Self-pay

## 2019-09-11 VITALS — BP 137/78 | HR 72 | Temp 98.0°F | Ht 64.0 in | Wt 144.0 lb

## 2019-09-11 DIAGNOSIS — J4521 Mild intermittent asthma with (acute) exacerbation: Secondary | ICD-10-CM

## 2019-09-11 DIAGNOSIS — M25562 Pain in left knee: Secondary | ICD-10-CM

## 2019-09-11 NOTE — Progress Notes (Signed)
8/12/20212:14 PM  Kathryn Gomez Jul 16, 1940, 79 y.o., female [id number]  Chief Complaint  Patient presents with  . Leg Swelling    L foll up/ Verc veins    HPI:   Patient is a 79 y.o. female with past medical history significant for HTN, HLP, UI, mild intermittent asthma, prediabetes, osteopenia who presents today for followup  Seen a week ago - tx with pred for asthma, also having issues with LEFT knee pain  Reports breathing is back to normal No more chest tightness or SOB Left knee pain has also resolved She is feeling well and will be starting back work on [employer] She has no acute concerns today   Depression screen [facility] 2/9 09/11/2019 09/05/2019 04/10/2019  Decreased Interest 0 0 0  Down, Depressed, Hopeless 0 0 -  PHQ - 2 Score 0 0 0    Fall Risk  09/11/2019 09/05/2019 04/10/2019 10/10/2018 04/11/2018  Falls in the past year? 0 0 0 0 0  Number falls in past yr: 0 0 0 0 0  Injury with Fall? 0 0 0 0 0  Risk for fall due to : - No Fall Risks - Orthopedic patient -  Follow up Falls evaluation completed Falls evaluation completed - - Falls evaluation completed     No Known Allergies  Prior to Admission medications   Medication Sig Start Date End Date Taking? Authorizing Provider  albuterol (PROAIR HFA) 108 (90 Base) MCG/ACT inhaler TAKE 2 PUFFS BY MOUTH EVERY 6 HOURS AS NEEDED FOR WHEEZE OR SHORTNESS OF BREATH 09/05/19  Yes Myles Lipps, MD  amLODipine (NORVASC) 5 MG tablet Take 1 tablet (5 mg total) by mouth daily. 04/10/19  Yes Myles Lipps, MD  aspirin 81 MG tablet Take 81 mg by mouth daily.   Yes [provider]  fluticasone (FLONASE) 50 MCG/ACT nasal spray Place 1 spray into both nostrils 2 (two) times daily. 04/10/19  Yes Myles Lipps, MD  lisinopril-hydrochlorothiazide (ZESTORETIC) 20-12.5 MG tablet TAKE 2 TABLETS BY MOUTH EVERY DAY 04/10/19  Yes Myles Lipps, MD  loratadine (CLARITIN) 10 MG tablet Take 10 mg by mouth daily.   Yes [provider]  OVER THE COUNTER MEDICATION OTC PreserVision eye vitamin and mineral supplement taking one in the am and one at night   Yes [provider]  oxybutynin (DITROPAN-XL) 5 MG 24 hr tablet Take 1 tablet (5 mg total) by mouth at bedtime. 04/10/19  Yes Myles Lipps, MD  predniSONE (DELTASONE) 20 MG tablet Take 2 tablets (40 mg total) by mouth daily with breakfast. 09/05/19  Yes Myles Lipps, MD  Probiotic Product ([employer]S PROBIOTIC MAXIMUM STRENGTH) CAPS Take 1 capsule by mouth daily. 04/02/14  Yes Maurice March, MD  simvastatin (ZOCOR) 20 MG tablet TAKE 1 TABLET BY MOUTH EVERY DAY IN THE EVENING 10/10/18  Yes Myles Lipps, MD    Past Medical History:  Diagnosis Date  . Allergy   . Arthritis   . Asthma   . Cataract    bilateral  . Hypercholesteremia   . Hypertension   . Osteopenia 05/04/2016   T scare -1.2 at femur  . Pneumonia    hx of    Past Surgical History:  Procedure Laterality Date  . ABDOMINAL HYSTERECTOMY  Feb 1976   Pelvic pain (no cancer)  . FRACTURE SURGERY Right 20 years ago   two fingers  . TOTAL HIP ARTHROPLASTY Left 09/03/2012   Procedure: LEFT TOTAL HIP ARTHROPLASTY ANTERIOR  APPROACH;  Surgeon: Shelda Pal, MD;  Location: [facility];  Service: Orthopedics;  Laterality: Left;    Social History   Tobacco Use  . Smoking status: Never Smoker  . Smokeless tobacco: Never Used  Substance Use Topics  . Alcohol use: No    Family History  Problem Relation Age of Onset  . Hypertension Mother   . Hypertension Maternal Grandmother   . Hypertension Brother   . Obesity Brother     ROS Per hpi  OBJECTIVE:  Today's Vitals   09/11/19 1400  BP: 137/78  Pulse: 72  Temp: 98 F (36.7 C)  SpO2: 100%  Weight: 144 lb (65.3 kg)  Height: 5\' 4"  (1.626 m)   Body mass index is 24.72 kg/m.   Physical Exam Vitals and nursing note reviewed.  Constitutional:      Appearance: She is well-developed.  HENT:     Head: Normocephalic and atraumatic.      Mouth/Throat:     Pharynx: No oropharyngeal exudate.  Eyes:     General: No scleral icterus.    Extraocular Movements: Extraocular movements intact.     Conjunctiva/sclera: Conjunctivae normal.     Pupils: Pupils are equal, round, and reactive to light.  Cardiovascular:     Rate and Rhythm: Normal rate and regular rhythm.     Heart sounds: Normal heart sounds. No murmur heard.  No friction rub. No gallop.   Pulmonary:     Effort: Pulmonary effort is normal.     Breath sounds: Normal breath sounds. No wheezing, rhonchi or rales.  Musculoskeletal:     Cervical back: Neck supple.  Skin:    General: Skin is warm and dry.  Neurological:     Mental Status: She is alert and oriented to person, place, and time.       No results found for this or any previous visit (from the past 24 hour(s)).  No results found.   ASSESSMENT and PLAN  1. Mild intermittent asthma with acute exacerbation Resolved  2. Acute pain of left knee Resolved   Released back to work wo restrictions  Return for as scheduled.    , MD Primary Care at [facility] [address] [place], [place] Ph.  [phone] Fax [phone]

## 2019-09-11 NOTE — Patient Instructions (Signed)
° ° ° °  If you have lab work done today you will be contacted with your lab results within the next 2 weeks.  If you have not heard from us then please contact us. The fastest way to get your results is to register for My Chart. ° ° °IF you received an x-ray today, you will receive an invoice from [facility]. Please contact [facility] at [phone] with questions or concerns regarding your invoice.  ° °IF you received labwork today, you will receive an invoice from [facility]. Please contact [facility] at [phone] with questions or concerns regarding your invoice.  ° °Our billing staff will not be able to assist you with questions regarding bills from these companies. ° °You will be contacted with the lab results as soon as they are available. The fastest way to get your results is to activate your My Chart account. Instructions are located on the last page of this paperwork. If you have not heard from us regarding the results in 2 weeks, please contact this office. °  ° ° ° °

## 2019-10-09 ENCOUNTER — Other Ambulatory Visit: Payer: Self-pay | Admitting: Family Medicine

## 2019-10-09 ENCOUNTER — Ambulatory Visit (INDEPENDENT_AMBULATORY_CARE_PROVIDER_SITE_OTHER): Payer: Medicare Other | Admitting: Family Medicine

## 2019-10-09 ENCOUNTER — Other Ambulatory Visit: Payer: Self-pay

## 2019-10-09 ENCOUNTER — Encounter: Payer: Self-pay | Admitting: Family Medicine

## 2019-10-09 VITALS — BP 130/84 | HR 68 | Temp 97.0°F | Ht 64.0 in | Wt 147.0 lb

## 2019-10-09 DIAGNOSIS — I10 Essential (primary) hypertension: Secondary | ICD-10-CM | POA: Diagnosis not present

## 2019-10-09 DIAGNOSIS — N3941 Urge incontinence: Secondary | ICD-10-CM | POA: Diagnosis not present

## 2019-10-09 DIAGNOSIS — M7521 Bicipital tendinitis, right shoulder: Secondary | ICD-10-CM

## 2019-10-09 DIAGNOSIS — E7800 Pure hypercholesterolemia, unspecified: Secondary | ICD-10-CM | POA: Diagnosis not present

## 2019-10-09 DIAGNOSIS — R7303 Prediabetes: Secondary | ICD-10-CM

## 2019-10-09 MED ORDER — AMLODIPINE BESYLATE 5 MG PO TABS: 5.0000 mg | ORAL_TABLET | Freq: Every day | ORAL | 3 refills | Status: AC

## 2019-10-09 MED ORDER — OXYBUTYNIN CHLORIDE ER 5 MG PO TB24: 5.0000 mg | ORAL_TABLET | Freq: Every day | ORAL | 3 refills | Status: DC

## 2019-10-09 MED ORDER — MELOXICAM 7.5 MG PO TABS: 7.5000 mg | ORAL_TABLET | Freq: Every evening | ORAL | 1 refills | Status: AC | PRN

## 2019-10-09 MED ORDER — LISINOPRIL-HYDROCHLOROTHIAZIDE 20-12.5 MG PO TABS: 2.0000 | ORAL_TABLET | Freq: Every day | ORAL | 3 refills | Status: AC

## 2019-10-09 MED ORDER — SIMVASTATIN 20 MG PO TABS: ORAL_TABLET | ORAL | 3 refills | Status: AC

## 2019-10-09 NOTE — Progress Notes (Signed)
9/9/202111:02 AM  Kathryn Gomez [birth year], 79 y.o., female [id number]  Chief Complaint  Patient presents with  . Follow-up    htn, prediabetes, hlp, urinary incontinance, medication refill  . Arm Pain    right arm pain dominate arm used at work    HPI:   Patient is a 79 y.o. 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  She is taking all her other meds as prescribed She reports UI acceptable control with oxybutynin   Lab Results  Component Value Date   HGBA1C 5.9 (H) 04/10/2019   HGBA1C 5.7 (H) 10/10/2018   HGBA1C 5.9 (H) 04/11/2018   Lab Results  Component Value Date   LDLCALC 68 04/10/2019   CREATININE 1.04 (H) 04/10/2019   BP Readings from Last 3 Encounters:  10/09/19 130/84  09/11/19 137/78  09/05/19 96/60    Depression screen PHQ 2/9 09/11/2019 09/05/2019 04/10/2019  Decreased Interest 0 0 0  Down, Depressed, Hopeless 0 0 -  PHQ - 2 Score 0 0 0    Fall Risk  10/09/2019 09/11/2019 09/05/2019 04/10/2019 10/10/2018  Falls in the past year? 0 0 0 0 0  Number falls in past yr: 0 0 0 0 0  Injury with Fall? 0 0 0 0 0  Risk for fall due to : - - No Fall Risks - Orthopedic patient  Follow up - Falls evaluation completed Falls evaluation completed - -     No Known Allergies  Prior to Admission medications   Medication Sig Start Date End Date Taking? Authorizing Provider  albuterol (PROAIR HFA) 108 (90 Base) MCG/ACT inhaler TAKE 2 PUFFS BY MOUTH EVERY 6 HOURS AS NEEDED FOR WHEEZE OR SHORTNESS OF BREATH 09/05/19  Yes SRutherford Guys MD  amLODipine (NORVASC)  5 MG tablet Take 1 tablet (5 mg total) by mouth daily. 04/10/19  Yes SRutherford Guys MD  aspirin 81 MG tablet Take 81 mg by mouth daily.   Yes [provider]  fluticasone (FLONASE) 50 MCG/ACT nasal spray PLACE 1 SPRAY INTO BOTH NOSTRILS 2 (TWO) TIMES DAILY. 10/09/19  Yes SRutherford Guys MD  lisinopril-hydrochlorothiazide (ZESTORETIC) 20-12.5 MG tablet TAKE 2 TABLETS BY MOUTH EVERY DAY 04/10/19  Yes SRutherford Guys MD  loratadine (CLARITIN) 10 MG tablet Take 10 mg by mouth daily.   Yes [provider]  OVER THE COUNTER MEDICATION OTC PreserVision eye vitamin and mineral supplement taking one in the am and one at night   Yes [provider]  oxybutynin (DITROPAN-XL) 5 MG 24 hr tablet Take 1 tablet (5 mg total) by mouth at bedtime. 04/10/19  Yes SRutherford Guys MD  Probiotic Product ([employer] PROBIOTIC MAXIMUM STRENGTH) CAPS Take 1 capsule by mouth daily. 04/02/14  Yes MBarton Fanny MD  simvastatin (ZOCOR) 20 MG tablet TAKE 1 TABLET BY MOUTH EVERY DAY IN THE EVENING 10/10/18  Yes SRutherford Guys MD    Past Medical History:  Diagnosis Date  . Allergy   . Arthritis   . Asthma   . Cataract    bilateral  . Hypercholesteremia   . Hypertension   .  Osteopenia 05/04/2016   T scare -1.2 at femur  . Pneumonia    hx of    Past Surgical History:  Procedure Laterality Date  . ABDOMINAL HYSTERECTOMY  Feb 1976   Pelvic pain (no cancer)  . FRACTURE SURGERY Right 20 years ago   two fingers  . TOTAL HIP ARTHROPLASTY Left 09/03/2012   Procedure: LEFT TOTAL HIP ARTHROPLASTY ANTERIOR APPROACH;  Surgeon: Mauri Pole, MD;  Location: [facility];  Service: Orthopedics;  Laterality: Left;    Social History   Tobacco Use  . Smoking status: Never Smoker  . Smokeless tobacco: Never Used  Substance Use Topics  . Alcohol use: No    Family History  Problem Relation Age of Onset  . Hypertension Mother   . Hypertension Maternal Grandmother   . Hypertension Brother   .  Obesity Brother     Review of Systems  Constitutional: Negative for chills and fever.  Respiratory: Negative for cough and shortness of breath.   Cardiovascular: Negative for chest pain, palpitations and leg swelling.  Gastrointestinal: Negative for abdominal pain, nausea and vomiting.   Per hpi  OBJECTIVE:  Today's Vitals   10/09/19 1050  BP: 130/84  Pulse: 68  Temp: (!) 97 F (36.1 C)  SpO2: 98%  Weight: 147 lb (66.7 kg)  Height: _0  (1.626 m)   Body mass index is 25.23 kg/m.   Physical Exam Vitals and nursing note reviewed.  Constitutional:      Appearance: She is well-developed.  HENT:     Head: Normocephalic and atraumatic.     Mouth/Throat:     Pharynx: No oropharyngeal exudate.  Eyes:     General: No scleral icterus.    Extraocular Movements: Extraocular movements intact.     Conjunctiva/sclera: Conjunctivae normal.     Pupils: Pupils are equal, round, and reactive to light.  Cardiovascular:     Rate and Rhythm: Normal rate and regular rhythm.     Heart sounds: Normal heart sounds. No murmur heard.  No friction rub. No gallop.   Pulmonary:     Effort: Pulmonary effort is normal.     Breath sounds: Normal breath sounds. No wheezing, rhonchi or rales.  Musculoskeletal:     Right shoulder: Tenderness (biceptal groove) present. No swelling or bony tenderness. Normal range of motion. Normal strength. Normal pulse.     Cervical back: Neck supple. Tenderness (right upper trapezius) present. No bony tenderness. Normal range of motion.     Right lower leg: No edema.     Left lower leg: No edema.  Skin:    General: Skin is warm and dry.  Neurological:     Mental Status: She is alert and oriented to person, place, and time.     No results found for this or any previous visit (from the past 24 hour(s)).  No results found.   ASSESSMENT and PLAN  1. Biceps tendinitis of right upper extremity Discussed supportive measures, new meds r/se/b and RTC  precautions.   2. Essential hypertension Controlled. Continue current regime.  - amLODipine (NORVASC) 5 MG tablet; Take 1 tablet (5 mg total) by mouth daily. - lisinopril-hydrochlorothiazide (ZESTORETIC) 20-12.5 MG tablet; Take 2 tablets by mouth daily. - Lipid panel - CMP14+EGFR  3. Pure hypercholesterolemia Checking labs today, medications will be adjusted as needed.  - simvastatin (ZOCOR) 20 MG tablet; TAKE 1 TABLET BY MOUTH EVERY DAY IN THE EVENING - Lipid panel - CMP14+EGFR  4. Urge urinary incontinence Controlled. Continue current regime.  -  oxybutynin (DITROPAN-XL) 5 MG 24 hr tablet; Take 1 tablet (5 mg total) by mouth at bedtime.   5. Prediabetes - Hemoglobin A1c  Other orders - meloxicam (MOBIC) 7.5 MG tablet; Take 1 tablet (7.5 mg total) by mouth at bedtime as needed for pain (take with food or milk).  Return in about 6 months (around 04/07/2020).    Rutherford Guys, MD Primary Care at [place] [facility], [place] [address] Ph.  [phone] Fax [phone]

## 2019-10-09 NOTE — Telephone Encounter (Signed)
  Notes to clinic Has appt today with Dr. Leretha Pol, did not want to approve in case she changes dose or discontinues.

## 2019-10-09 NOTE — Patient Instructions (Signed)
° ° ° °  If you have lab work done today you will be contacted with your lab results within the next 2 weeks.  If you have not heard from us then please contact us. The fastest way to get your results is to register for My Chart. ° ° °IF you received an x-ray today, you will receive an invoice from [facility]. Please contact [facility] at [phone] with questions or concerns regarding your invoice.  ° °IF you received labwork today, you will receive an invoice from [facility]. Please contact [facility] at [phone] with questions or concerns regarding your invoice.  ° °Our billing staff will not be able to assist you with questions regarding bills from these companies. ° °You will be contacted with the lab results as soon as they are available. The fastest way to get your results is to activate your My Chart account. Instructions are located on the last page of this paperwork. If you have not heard from us regarding the results in 2 weeks, please contact this office. °  ° ° ° °

## 2019-10-10 LAB — CMP14+EGFR
ALT: 8 IU/L (ref 0–32)
AST: 15 IU/L (ref 0–40)
Albumin/Globulin Ratio: 1.5 (ref 1.2–2.2)
Albumin: 4.3 g/dL (ref 3.7–4.7)
Alkaline Phosphatase: 87 IU/L (ref 48–121)
BUN/Creatinine Ratio: 24 (ref 12–28)
BUN: 21 mg/dL (ref 8–27)
Bilirubin Total: 0.3 mg/dL (ref 0.0–1.2)
CO2: 21 mmol/L (ref 20–29)
Calcium: 9.6 mg/dL (ref 8.7–10.3)
Chloride: 105 mmol/L (ref 96–106)
Creatinine, Ser: 0.89 mg/dL (ref 0.57–1.00)
GFR calc Af Amer: 71 mL/min/{1.73_m2} (ref >59)
GFR calc non Af Amer: 62 mL/min/{1.73_m2} (ref >59)
Globulin, Total: 2.9 g/dL (ref 1.5–4.5)
Glucose: 107 mg/dL — ABNORMAL HIGH (ref 65–99)
Potassium: 4.8 mmol/L (ref 3.5–5.2)
Sodium: 141 mmol/L (ref 134–144)
Total Protein: 7.2 g/dL (ref 6.0–8.5)

## 2019-10-10 LAB — LIPID PANEL
Chol/HDL Ratio: 2.6 ratio (ref 0.0–4.4)
Cholesterol, Total: 135 mg/dL (ref 100–199)
HDL: 52 mg/dL (ref >39)
LDL Chol Calc (NIH): 68 mg/dL (ref 0–99)
Triglycerides: 74 mg/dL (ref 0–149)
VLDL Cholesterol Cal: 15 mg/dL (ref 5–40)

## 2019-10-10 LAB — HEMOGLOBIN A1C
Est. average glucose Bld gHb Est-mCnc: 117 mg/dL
Hgb A1c MFr Bld: 5.7 % — ABNORMAL HIGH (ref 4.8–5.6)

## 2019-10-13 ENCOUNTER — Telehealth: Payer: Self-pay | Admitting: *Deleted

## 2019-10-13 NOTE — Telephone Encounter (Signed)
Schedule AWV.  

## 2019-10-21 ENCOUNTER — Encounter: Payer: Self-pay | Admitting: Radiology

## 2020-01-15 ENCOUNTER — Other Ambulatory Visit: Payer: Self-pay | Admitting: Internal Medicine

## 2020-01-15 ENCOUNTER — Ambulatory Visit
Admission: RE | Admit: 2020-01-15 | Discharge: 2020-01-15 | Disposition: A | Discharge status: Home / Self Care | Payer: Medicare Other | Source: Ambulatory Visit | Attending: Internal Medicine | Admitting: Internal Medicine

## 2020-01-15 DIAGNOSIS — R52 Pain, unspecified: Secondary | ICD-10-CM

## 2020-04-15 ENCOUNTER — Other Ambulatory Visit: Payer: Self-pay

## 2020-04-15 ENCOUNTER — Telehealth (INDEPENDENT_AMBULATORY_CARE_PROVIDER_SITE_OTHER): Payer: Medicare Other | Admitting: Family Medicine

## 2020-04-15 ENCOUNTER — Encounter: Payer: Self-pay | Admitting: Family Medicine

## 2020-04-15 VITALS — BP 114/61 | HR 84 | Ht 64.0 in | Wt 140.0 lb

## 2020-04-15 DIAGNOSIS — N3941 Urge incontinence: Secondary | ICD-10-CM

## 2020-04-15 DIAGNOSIS — J4520 Mild intermittent asthma, uncomplicated: Secondary | ICD-10-CM

## 2020-04-15 DIAGNOSIS — M85859 Other specified disorders of bone density and structure, unspecified thigh: Secondary | ICD-10-CM

## 2020-04-15 DIAGNOSIS — I10 Essential (primary) hypertension: Secondary | ICD-10-CM

## 2020-04-15 DIAGNOSIS — R7303 Prediabetes: Secondary | ICD-10-CM | POA: Diagnosis not present

## 2020-04-15 DIAGNOSIS — E7800 Pure hypercholesterolemia, unspecified: Secondary | ICD-10-CM | POA: Diagnosis not present

## 2020-04-15 MED ORDER — OXYBUTYNIN CHLORIDE ER 10 MG PO TB24: 10.0000 mg | ORAL_TABLET | Freq: Every day | ORAL | 1 refills | Status: DC

## 2020-04-15 NOTE — Patient Instructions (Signed)
Can try the higher dose of oxybutynin once per day.  Watch for any lightheadedness, dizziness, dry mouth or other new side effects at that dose and let me know if those occur.   I also recommend at least 1200 to 1500 mg of calcium and 1000 units of vitamin D each day.  Os-Cal plus D is one supplement that can help achieve those numbers or through the diet.  No other medication changes at this time.    Please follow-up in 6 months at my new location below but let me know if there are any questions in the meantime.  Take care.   Here are a few lab drawing stations for you to have your labwork performed:  [facility] [address] [place], [place] [address]  [facility] [address] [place], [place] [address]    Here is my new office: [facility] Address: [address], [place], [place] [address] Phone: [phone]

## 2020-04-15 NOTE — Progress Notes (Signed)
Virtual Visit via Telephone Note  I connected with Kathryn Gomez on 04/15/20 at 9:54 AM by telephone and verified that I am speaking with the correct person using two identifiers. Patient location:home My location: home   I discussed the limitations, risks, security and privacy concerns of performing an evaluation and management service by telephone and the availability of in person appointments. I also discussed with the patient that there may be a patient responsible charge related to this service. The patient expressed understanding and agreed to proceed, consent obtained  Chief complaint:  Chief Complaint  Patient presents with   Transitions Of Care    Pt states she dose not have any concerns at this time.    History of Present Illness: Kathryn Gomez is a 80 y.o. female  Transition of care visit, previous primary provider Dr. Leretha Pol, with last visit in September 2021.  History of hypertension, hyperlipidemia, urge incontinence, mild intermittent asthma, prediabetes, osteopenia.  Doing well. No specific concerns.   Prediabetes: Borderline A1c in September.  Weight down 7 pounds by her report today on home weights- stable on her scale.  Reports normal reading with home visit recently - 5?  Lab Results  Component Value Date   HGBA1C 5.7 (H) 10/09/2019   Wt Readings from Last 3 Encounters:  04/15/20 140 lb (63.5 kg)  10/09/19 147 lb (66.7 kg)  09/11/19 144 lb (65.3 kg)   Urge incontinence Treated with oxybutynin 5 mg extended release daily.  Fair control with current regimen. Having to urinate every few hours at night. Does do ok during the day.  No dizziness or new side effects.   Mild intermittent asthma No daily meds.  Does use Flonase as needed for allergies, has albuterol if needed. No recent albuterol need. Still using flonase. Has albuterol if needed.   Hypertension: Lisinopril/HCTZ 40/25mg  daily, amlodipine 5 mg daily. no new side effects.  Home readings:  up and down - usually controlled. 128/62 on 04/01/20  Constitutional: Negative for fatigue and unexpected weight change.  Eyes: Negative for visual disturbance.  Respiratory: Negative for cough, chest tightness and shortness of breath.   Cardiovascular: Negative for chest pain, palpitations and leg swelling.  Gastrointestinal: Negative for abdominal pain and blood in stool.  Neurological: Negative for dizziness, light-headedness and headaches.    BP Readings from Last 3 Encounters:  04/15/20 114/61  10/09/19 130/84  09/11/19 137/78   Lab Results  Component Value Date   CREATININE 0.89 10/09/2019   Hyperlipidemia: Simvastatin 20 mg daily. No new myalgias or side effects.  Lab Results  Component Value Date   CHOL 135 10/09/2019   HDL 52 10/09/2019   LDLCALC 68 10/09/2019   TRIG 74 10/09/2019   CHOLHDL 2.6 10/09/2019   Lab Results  Component Value Date   ALT 8 10/09/2019   AST 15 10/09/2019   ALKPHOS 87 10/09/2019   BILITOT 0.3 10/09/2019   Osteopenia Bone density August 01, 2018, T score -1.1 at the femoral neck without significant change of total right hip since prior exam in 2018.  Left hip was excluded due to surgical hardware. Drinking milk, cheese on occasion.  No vit or calcium supplements.  Discussed calcium and vit D supplement and dietary sources.     Patient Active Problem List   Diagnosis Date Noted   Urge urinary incontinence 04/10/2019   Prediabetes 01/28/2017   Bilateral low back pain without sciatica 10/05/2015   Diarrhea 09/24/2014   Overweight (BMI 25.0-29.9) 09/04/2012  S/P left THA, AA 09/03/2012   Pure hypercholesterolemia 03/26/2011   Hypertension 03/26/2011   Asthma 03/26/2011   Past Medical History:  Diagnosis Date   Allergy    Arthritis    Asthma    Cataract    bilateral   Hypercholesteremia    Hypertension    Osteopenia 05/04/2016   T scare -1.2 at femur   Pneumonia    hx of   Past Surgical History:  Procedure  Laterality Date   ABDOMINAL HYSTERECTOMY  Feb 1976   Pelvic pain (no cancer)   FRACTURE SURGERY Right 20 years ago   two fingers   TOTAL HIP ARTHROPLASTY Left 09/03/2012   Procedure: LEFT TOTAL HIP ARTHROPLASTY ANTERIOR APPROACH;  Surgeon: Shelda Pal, MD;  Location: [facility];  Service: Orthopedics;  Laterality: Left;   No Known Allergies Prior to Admission medications   Medication Sig Start Date End Date Taking? Authorizing Provider  albuterol (PROAIR HFA) 108 (90 Base) MCG/ACT inhaler TAKE 2 PUFFS BY MOUTH EVERY 6 HOURS AS NEEDED FOR WHEEZE OR SHORTNESS OF BREATH 09/05/19  Yes Lezlie Lye, Irma M, MD  amLODipine (NORVASC) 5 MG tablet Take 1 tablet (5 mg total) by mouth daily. 10/09/19  Yes Lezlie Lye, Meda Coffee, MD  aspirin 81 MG tablet Take 81 mg by mouth daily.   Yes [provider]  fluticasone (FLONASE) 50 MCG/ACT nasal spray PLACE 1 SPRAY INTO BOTH NOSTRILS 2 (TWO) TIMES DAILY. 10/09/19  Yes Lezlie Lye, Meda Coffee, MD  lisinopril-hydrochlorothiazide (ZESTORETIC) 20-12.5 MG tablet Take 2 tablets by mouth daily. 10/09/19  Yes Lezlie Lye, Meda Coffee, MD  loratadine (CLARITIN) 10 MG tablet Take 10 mg by mouth daily.   Yes [provider]  meloxicam (MOBIC) 7.5 MG tablet Take 1 tablet (7.5 mg total) by mouth at bedtime as needed for pain (take with food or milk). 10/09/19  Yes Lezlie Lye, Meda Coffee, MD  OVER THE COUNTER MEDICATION OTC PreserVision eye vitamin and mineral supplement taking one in the am and one at night   Yes [provider]  oxybutynin (DITROPAN-XL) 5 MG 24 hr tablet Take 1 tablet (5 mg total) by mouth at bedtime. 10/09/19  Yes Lezlie Lye, Meda Coffee, MD  Probiotic Product ([employer]S PROBIOTIC MAXIMUM STRENGTH) CAPS Take 1 capsule by mouth daily. 04/02/14  Yes Maurice March, MD  simvastatin (ZOCOR) 20 MG tablet TAKE 1 TABLET BY MOUTH EVERY DAY IN THE EVENING 10/09/19  Yes Lezlie Lye, Meda Coffee, MD   Social History   Socioeconomic History   Marital status:  Widowed    Spouse name: n/a   Number of children: 3   Years of education: 11th grade   Highest education level: 11th grade  Occupational History   Occupation: Data processing manager: [employer]  Tobacco Use   Smoking status: Never Smoker   Smokeless tobacco: Never Used  Vaping Use   Vaping Use: Never used  Substance and Sexual Activity   Alcohol use: No   Drug use: No   Sexual activity: Not Currently  Other Topics Concern   Not on file  Social History Narrative   Does not exercise.    Grandson lives with her.   Her great grandsons stay with her on the weekends.   Has a very strong Faith.   Social Determinants of Health   Financial Resource Strain: Not on file  Food Insecurity: Not on file  Transportation Needs: Not on file  Physical Activity: Not  on file  Stress: Not on file  Social Connections: Not on file  Intimate Partner Violence: Not on file     Observations/Objective: Vitals:   04/15/20 0847  BP: 114/61  Pulse: 84  Weight: 140 lb (63.5 kg)  Height: 5\' 4"  (1.626 m)  No distress on phone, appropriate responses, normal speech, no respiratory distress.  Speaking in full sentences.  All questions were answered with understanding of plan expressed.   Assessment and Plan: Essential hypertension - Plan: Comprehensive metabolic panel  -Controlled by home readings, tolerating current meds, continue same regimen, 1 year refills provided last September.  Labs ordered  Pure hypercholesterolemia - Plan: Lipid panel  -Tolerating statin, continue same, labs ordered  Urge urinary incontinence - Plan: oxybutynin (DITROPAN-XL) 10 MG 24 hr tablet  -Tolerating meds, somewhat decreased control overnight, would like to try higher dose, will start 10 mg with potential side effects and risk discussed.  RTC precautions.  Prediabetes - Plan: Hemoglobin A1c  -Check A1c but reportedly had a normal reading at home screening recently.  Osteopenia of hip,  unspecified laterality  -Calcium and vitamin D recommendations discussed including supplement if needed.  Repeat bone density can be ordered at next visit.  Mild intermittent asthma without complication  -Stable with Flonase for allergies, has albuterol if needed but no recent use.  Follow Up Instructions: 6 months follow-up. Patient Instructions  Can try the higher dose of oxybutynin once per day.  Watch for any lightheadedness, dizziness, dry mouth or other new side effects at that dose and let me know if those occur.   I also recommend at least 1200 to 1500 mg of calcium and 1000 units of vitamin D each day.  Os-Cal plus D is one supplement that can help achieve those numbers or through the diet.  No other medication changes at this time.    Please follow-up in 6 months at my new location below but let me know if there are any questions in the meantime.  Take care.   Here are a few lab drawing stations for you to have your labwork performed:  [facility] [address], [address] [place], [place]  [facility] [address] [place], [place]    Here is my new office: [facility] Address: [address], [place], [place] Phone: [phone]       I discussed the assessment and treatment plan with the patient. The patient was provided an opportunity to ask questions and all were answered. The patient agreed with the plan and demonstrated an understanding of the instructions.   The patient was advised to call back or seek an in-person evaluation if the symptoms worsen or if the condition fails to improve as anticipated.  I provided 32 minutes of non-face-to-face time during this encounter.  Signed,   [phone], MD Primary Care at [facility].  04/15/20

## 2020-07-08 ENCOUNTER — Other Ambulatory Visit: Payer: Self-pay | Admitting: Family Medicine

## 2020-07-08 DIAGNOSIS — Z1231 Encounter for screening mammogram for malignant neoplasm of breast: Secondary | ICD-10-CM

## 2020-07-22 ENCOUNTER — Ambulatory Visit
Admission: RE | Admit: 2020-07-22 | Discharge: 2020-07-22 | Disposition: A | Discharge status: Home / Self Care | Payer: Medicare Other | Source: Ambulatory Visit | Attending: Family Medicine | Admitting: Family Medicine

## 2020-07-22 ENCOUNTER — Other Ambulatory Visit: Payer: Self-pay | Admitting: Internal Medicine

## 2020-07-22 ENCOUNTER — Other Ambulatory Visit: Payer: Self-pay

## 2020-07-22 DIAGNOSIS — Z1231 Encounter for screening mammogram for malignant neoplasm of breast: Secondary | ICD-10-CM

## 2020-10-19 ENCOUNTER — Other Ambulatory Visit: Payer: Self-pay | Admitting: Family Medicine

## 2020-10-19 DIAGNOSIS — N3941 Urge incontinence: Secondary | ICD-10-CM

## 2021-07-05 ENCOUNTER — Other Ambulatory Visit: Payer: Self-pay | Admitting: Family Medicine

## 2021-07-05 DIAGNOSIS — N3941 Urge incontinence: Secondary | ICD-10-CM

## 2021-07-11 ENCOUNTER — Other Ambulatory Visit: Payer: Self-pay | Admitting: Internal Medicine

## 2021-07-11 DIAGNOSIS — Z1231 Encounter for screening mammogram for malignant neoplasm of breast: Secondary | ICD-10-CM

## 2021-07-28 ENCOUNTER — Ambulatory Visit
Admission: RE | Admit: 2021-07-28 | Discharge: 2021-07-28 | Disposition: A | Discharge status: Home / Self Care | Payer: Medicare Other | Source: Ambulatory Visit | Attending: Internal Medicine | Admitting: Internal Medicine

## 2021-07-28 DIAGNOSIS — Z1231 Encounter for screening mammogram for malignant neoplasm of breast: Secondary | ICD-10-CM

## 2022-04-28 ENCOUNTER — Ambulatory Visit
Admission: EM | Admit: 2022-04-28 | Discharge: 2022-04-28 | Disposition: A | Discharge status: Home / Self Care | Payer: Medicare Other | Attending: Family Medicine | Admitting: Family Medicine

## 2022-04-28 DIAGNOSIS — U071 COVID-19: Secondary | ICD-10-CM | POA: Diagnosis not present

## 2022-04-28 DIAGNOSIS — R509 Fever, unspecified: Secondary | ICD-10-CM | POA: Diagnosis present

## 2022-04-28 DIAGNOSIS — J069 Acute upper respiratory infection, unspecified: Secondary | ICD-10-CM | POA: Diagnosis not present

## 2022-04-28 DIAGNOSIS — R059 Cough, unspecified: Secondary | ICD-10-CM | POA: Diagnosis present

## 2022-04-28 LAB — POCT INFLUENZA A/B
Influenza A, POC: NEGATIVE
Influenza B, POC: NEGATIVE

## 2022-04-28 MED ORDER — BENZONATATE 100 MG PO CAPS
100.0000 mg | ORAL_CAPSULE | Freq: Three times a day (TID) | ORAL | 0 refills | Status: AC | PRN
Start: 2022-04-28 — End: ?

## 2022-04-28 NOTE — ED Triage Notes (Signed)
Pt reports cough and watery eyes x 1 week. Unsure of fever.

## 2022-04-28 NOTE — Discharge Instructions (Addendum)
Your flu test was negative   You have been swabbed for COVID, and the test will result in the next 24 hours. Our staff will call you if positive. If the COVID test is positive, you should quarantine until you are fever free for 24 hours and you are starting to feel better, and then take added precautions for the next 5 days, such as physical distancing/wearing a mask and good hand hygiene/washing.  We have drawn blood to check your kidney function numbers so we know what dose of medicine to prescribe you if you are positive for COVID  Take benzonatate 100 mg, 1 tab every 8 hours as needed for cough.  No

## 2022-04-28 NOTE — ED Provider Notes (Signed)
[facility]    CSN: [id number] Arrival date & time: 04/28/22  1228      History   Chief Complaint Chief Complaint  Patient presents with   Cough    HPI Kathryn Gomez is a 82 y.o. female.    Cough  Here for fever that began this morning.  She has had nasal congestion and drainage and some cough for the last 7 days.  Then today she started having subjective fever.  She did take some Tylenol earlier today, and now her temperature here is 100.4.  She does not feel short of breath and she does admit to some malaise due to the fever.  No vomiting or diarrhea.  Past Medical History:  Diagnosis Date   Allergy    Arthritis    Asthma    Cataract    bilateral   Hypercholesteremia    Hypertension    Osteopenia 05/04/2016   T scare -1.2 at femur   Pneumonia    hx of    Patient Active Problem List   Diagnosis Date Noted   Urge urinary incontinence 04/10/2019   Prediabetes 01/28/2017   Bilateral low back pain without sciatica 10/05/2015   Diarrhea 09/24/2014   Overweight (BMI 25.0-29.9) 09/04/2012   S/P left THA, AA 09/03/2012   Pure hypercholesterolemia 03/26/2011   Hypertension 03/26/2011   Asthma 03/26/2011    Past Surgical History:  Procedure Laterality Date   ABDOMINAL HYSTERECTOMY  Feb 1976   Pelvic pain (no cancer)   FRACTURE SURGERY Right 20 years ago   two fingers   TOTAL HIP ARTHROPLASTY Left 09/03/2012   Procedure: LEFT TOTAL HIP ARTHROPLASTY ANTERIOR APPROACH;  Surgeon: Mauri Pole, MD;  Location: [facility];  Service: Orthopedics;  Laterality: Left;    OB History     Gravida  3   Para      Term      Preterm      AB      Living  2      SAB      IAB      Ectopic      Multiple      Live Births               Home Medications    Prior to Admission medications   Medication Sig Start Date End Date Taking? Authorizing Provider  albuterol (PROAIR HFA) 108 (90 Base) MCG/ACT inhaler TAKE 2 PUFFS BY MOUTH EVERY 6 HOURS  AS NEEDED FOR WHEEZE OR SHORTNESS OF BREATH 09/05/19  Yes Jacelyn Pi, Irma M, MD  amLODipine (NORVASC) 5 MG tablet Take 1 tablet (5 mg total) by mouth daily. 10/09/19  Yes Jacelyn Pi, Lilia Argue, MD  benzonatate (TESSALON) 100 MG capsule Take 1 capsule (100 mg total) by mouth 3 (three) times daily as needed for cough. 04/28/22  Yes Barrett Henle, MD  cetirizine (ZYRTEC) 5 MG tablet Take 5 mg by mouth daily.   Yes [provider]  fluticasone (FLONASE) 50 MCG/ACT nasal spray PLACE 1 SPRAY INTO BOTH NOSTRILS 2 (TWO) TIMES DAILY. 10/09/19  Yes Jacelyn Pi, Lilia Argue, MD  lisinopril-hydrochlorothiazide (ZESTORETIC) 20-12.5 MG tablet Take 2 tablets by mouth daily. 10/09/19  Yes Jacelyn Pi, Lilia Argue, MD  oxybutynin (DITROPAN-XL) 10 MG 24 hr tablet TAKE 1 TABLET BY MOUTH EVERYDAY AT BEDTIME 10/20/20  Yes Wendie Agreste, MD  simvastatin (ZOCOR) 20 MG tablet TAKE 1 TABLET BY MOUTH EVERY DAY IN THE EVENING 10/09/19  Yes Pamella Pert  Claudie Leach, MD  UNABLE TO FIND Med Name: pearl vision pills   Yes [provider]  meloxicam (MOBIC) 7.5 MG tablet Take 1 tablet (7.5 mg total) by mouth at bedtime as needed for pain (take with food or milk). 10/09/19   Daleen Squibb, MD  OVER THE COUNTER MEDICATION OTC PreserVision eye vitamin and mineral supplement taking one in the am and one at night    [provider]  Probiotic Product ([employer] PROBIOTIC MAXIMUM STRENGTH) CAPS Take 1 capsule by mouth daily. 04/02/14   Barton Fanny, MD    Family History Family History  Problem Relation Age of Onset   Hypertension Mother    Hypertension Maternal Grandmother    Hypertension Brother    Obesity Brother     Social History Social History   Tobacco Use   Smoking status: Never   Smokeless tobacco: Never  Vaping Use   Vaping Use: Never used  Substance Use Topics   Alcohol use: No   Drug use: No     Allergies   Patient has no known allergies.   Review of Systems Review of Systems   Respiratory:  Positive for cough.      Physical Exam Triage Vital Signs ED Triage Vitals  Enc Vitals Group     BP 04/28/22 1254 130/84     Pulse Rate 04/28/22 1254 91     Resp 04/28/22 1254 18     Temp 04/28/22 1254 (!) 100.4 F (38 C)     Temp Source 04/28/22 1254 Oral     SpO2 04/28/22 1254 96 %     Weight --      Height --      Head Circumference --      Peak Flow --      Pain Score 04/28/22 1246 0     Pain Loc --      Pain Edu? --      Excl. in [place]? --    No data found.  Updated Vital Signs BP 130/84 (BP Location: Left Arm)   Pulse 91   Temp (!) 100.4 F (38 C) (Oral)   Resp 18   SpO2 96%   Visual Acuity Right Eye Distance:   Left Eye Distance:   Bilateral Distance:    Right Eye Near:   Left Eye Near:    Bilateral Near:     Physical Exam Vitals reviewed.  Constitutional:      General: She is not in acute distress.    Appearance: She is not toxic-appearing.  HENT:     Right Ear: Tympanic membrane and ear canal normal.     Left Ear: Tympanic membrane and ear canal normal.     Nose: Congestion present.     Mouth/Throat:     Mouth: Mucous membranes are moist.     Pharynx: No oropharyngeal exudate or posterior oropharyngeal erythema.  Eyes:     Extraocular Movements: Extraocular movements intact.     Conjunctiva/sclera: Conjunctivae normal.     Pupils: Pupils are equal, round, and reactive to light.  Cardiovascular:     Rate and Rhythm: Normal rate and regular rhythm.     Heart sounds: No murmur heard. Pulmonary:     Effort: Pulmonary effort is normal. No respiratory distress.     Breath sounds: No stridor. No wheezing, rhonchi or rales.     Comments: There are no rales, rhonchi, or wheezes.  Air movement is good. Musculoskeletal:  Cervical back: Neck supple.  Lymphadenopathy:     Cervical: No cervical adenopathy.  Skin:    Capillary Refill: Capillary refill takes less than 2 seconds.     Coloration: Skin is not jaundiced or pale.   Neurological:     General: No focal deficit present.     Mental Status: She is alert and oriented to person, place, and time.  Psychiatric:        Behavior: Behavior normal.      UC Treatments / Results  Labs (all labs ordered are listed, but only abnormal results are displayed) Labs Reviewed  SARS CORONAVIRUS 2 (TAT 6-24 HRS)  BASIC METABOLIC PANEL  POCT INFLUENZA A/B    EKG   Radiology No results found.  Procedures Procedures (including critical care time)  Medications Ordered in UC Medications - No data to display  Initial Impression / Assessment and Plan / UC Course  I have reviewed the triage vital signs and the nursing notes.  Pertinent labs & imaging results that were available during my care of the patient were reviewed by me and considered in my medical decision making (see chart for details).        Flu test is negative.  She is swabbed for COVID; BMP is also done as we do not have a creatinine and EGFR done in the last 2 years in epic.  There is also no other lab values in that timeframe in [place].  I know she has had symptoms of congestion and such for 6 days, but the fever just started in the last 24 hours.  I think she may have had 2 different illnesses hit her in the last week.  If her creatinine is normal and COVID is positive, she is a candidate for paxlovid   Vital signs are reassuring and she has absolutely no sensation of shortness of breath.  I do not think she has any need to have a chest x-ray today.  Final Clinical Impressions(s) / UC Diagnoses   Final diagnoses:  Viral URI with cough     Discharge Instructions      Your flu test was negative   You have been swabbed for COVID, and the test will result in the next 24 hours. Our staff will call you if positive. If the COVID test is positive, you should quarantine until you are fever free for 24 hours and you are starting to feel better, and then take added precautions for  the next 5 days, such as physical distancing/wearing a mask and good hand hygiene/washing.  We have drawn blood to check your kidney function numbers so we know what dose of medicine to prescribe you if you are positive for COVID  Take benzonatate 100 mg, 1 tab every 8 hours as needed for cough.  No     ED Prescriptions     Medication Sig Dispense Auth. Provider   benzonatate (TESSALON) 100 MG capsule Take 1 capsule (100 mg total) by mouth 3 (three) times daily as needed for cough. 21 capsule Barrett Henle, MD      PDMP not reviewed this encounter.   Barrett Henle, MD 04/28/22 [phone]

## 2022-04-29 LAB — BASIC METABOLIC PANEL
BUN/Creatinine Ratio: 13 (ref 12–28)
BUN: 11 mg/dL (ref 8–27)
CO2: 23 mmol/L (ref 20–29)
Calcium: 9.2 mg/dL (ref 8.7–10.3)
Chloride: 103 mmol/L (ref 96–106)
Creatinine, Ser: 0.85 mg/dL (ref 0.57–1.00)
Glucose: 101 mg/dL — ABNORMAL HIGH (ref 70–99)
Potassium: 5.1 mmol/L (ref 3.5–5.2)
Sodium: 140 mmol/L (ref 134–144)
eGFR: 68 mL/min/{1.73_m2} (ref >59)

## 2022-04-29 LAB — SARS CORONAVIRUS 2 (TAT 6-24 HRS): SARS Coronavirus 2: POSITIVE — AB

## 2022-06-28 ENCOUNTER — Other Ambulatory Visit: Payer: Self-pay | Admitting: Internal Medicine

## 2022-06-28 DIAGNOSIS — Z1231 Encounter for screening mammogram for malignant neoplasm of breast: Secondary | ICD-10-CM

## 2022-08-02 ENCOUNTER — Ambulatory Visit
Admission: RE | Admit: 2022-08-02 | Discharge: 2022-08-02 | Disposition: A | Discharge status: Home / Self Care | Payer: Medicare Other | Source: Ambulatory Visit | Attending: Internal Medicine | Admitting: Internal Medicine

## 2022-08-02 DIAGNOSIS — Z1231 Encounter for screening mammogram for malignant neoplasm of breast: Secondary | ICD-10-CM

## 2022-12-27 ENCOUNTER — Other Ambulatory Visit: Payer: Self-pay | Admitting: Internal Medicine

## 2022-12-27 ENCOUNTER — Ambulatory Visit
Admission: RE | Admit: 2022-12-27 | Discharge: 2022-12-27 | Disposition: A | Discharge status: Home / Self Care | Payer: Medicare Other | Source: Ambulatory Visit | Attending: Internal Medicine | Admitting: Internal Medicine

## 2022-12-27 DIAGNOSIS — M545 Low back pain, unspecified: Secondary | ICD-10-CM

## 2023-06-08 IMAGING — MG MM DIGITAL SCREENING BILAT W/ TOMO AND CAD
6 of 12 series · 6 of 36 positions shown · non-contrast
Comparison: Previous exam(s).

CLINICAL DATA: Screening.

EXAM:
DIGITAL SCREENING BILATERAL MAMMOGRAM WITH TOMOSYNTHESIS AND CAD
TECHNIQUE: Bilateral screening digital craniocaudal and mediolateral oblique
mammograms were obtained. Bilateral screening digital breast
tomosynthesis was performed. The images were evaluated with
computer-aided detection.

[L CC synth-2D (1 of 2)]
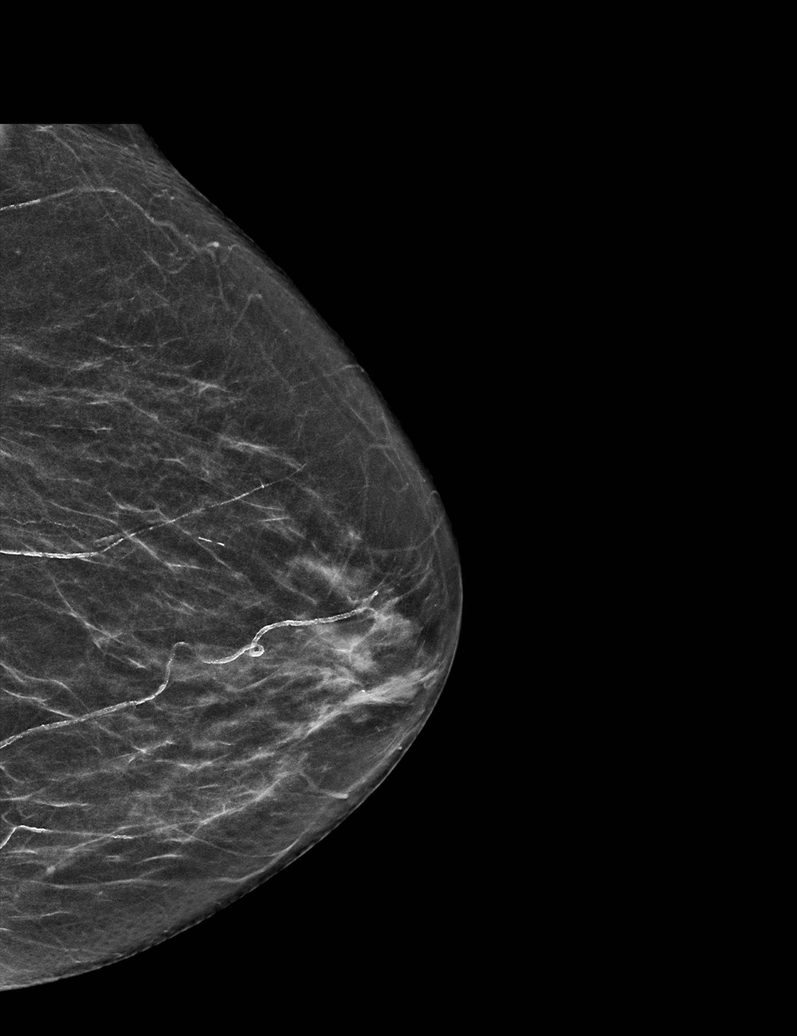

[L CC synth-2D (2 of 2)]
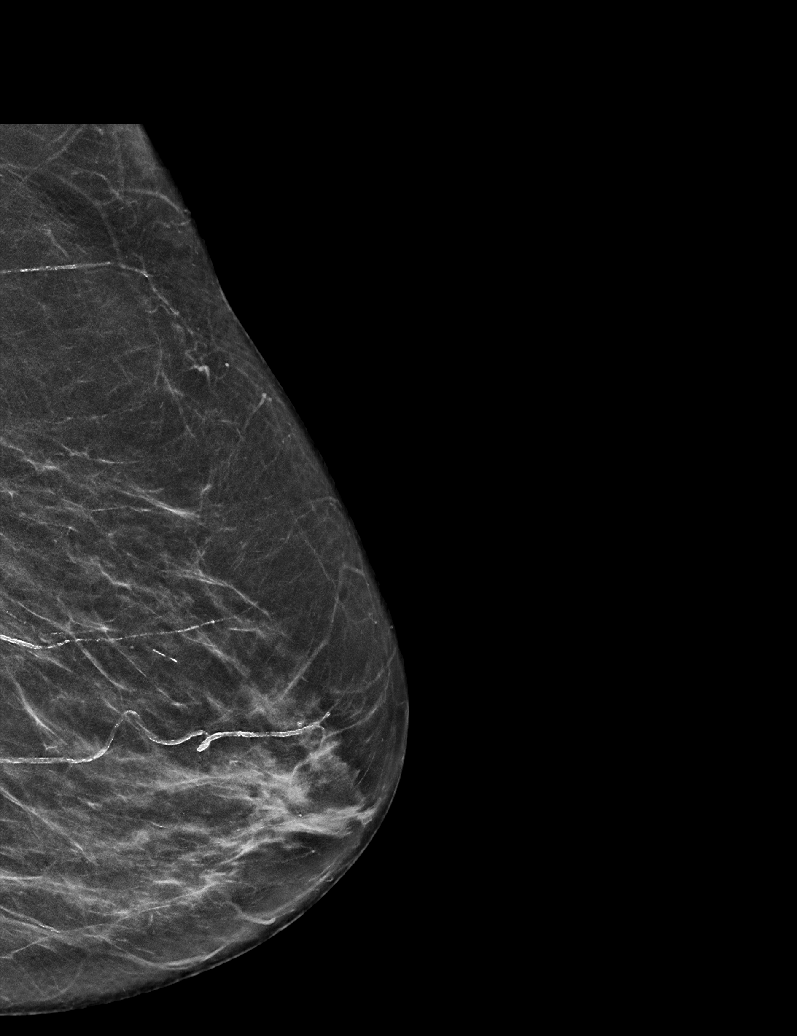

[R MLO synth-2D]
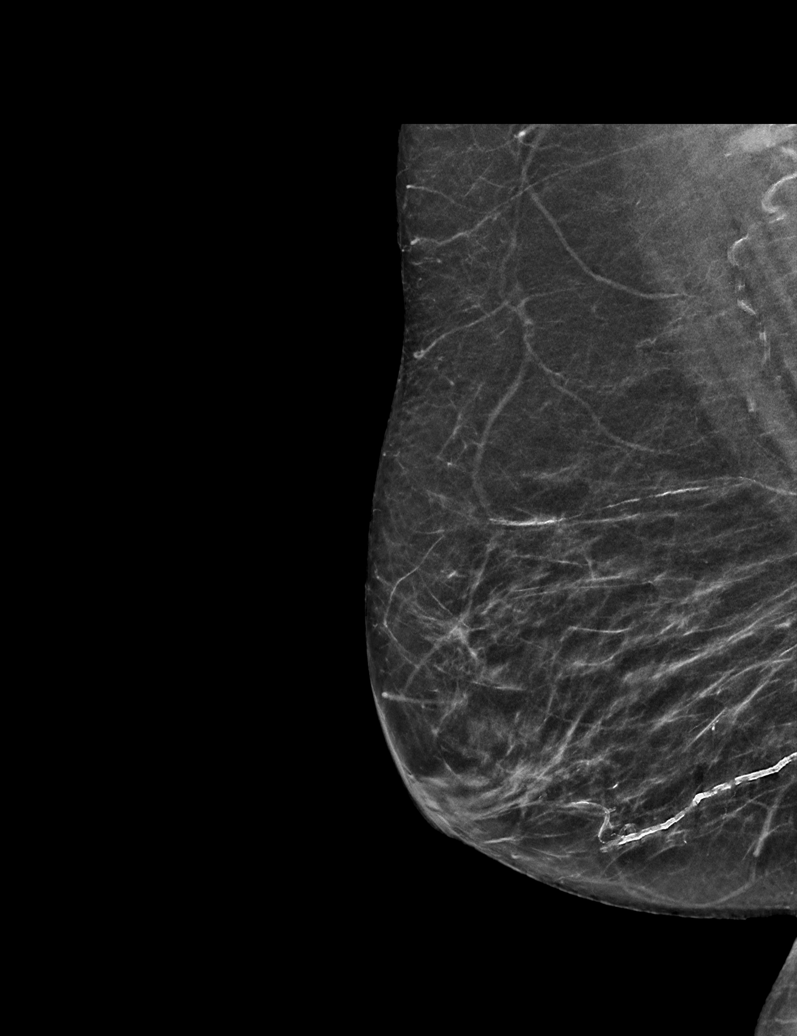

[L MLO synth-2D (1 of 2)]
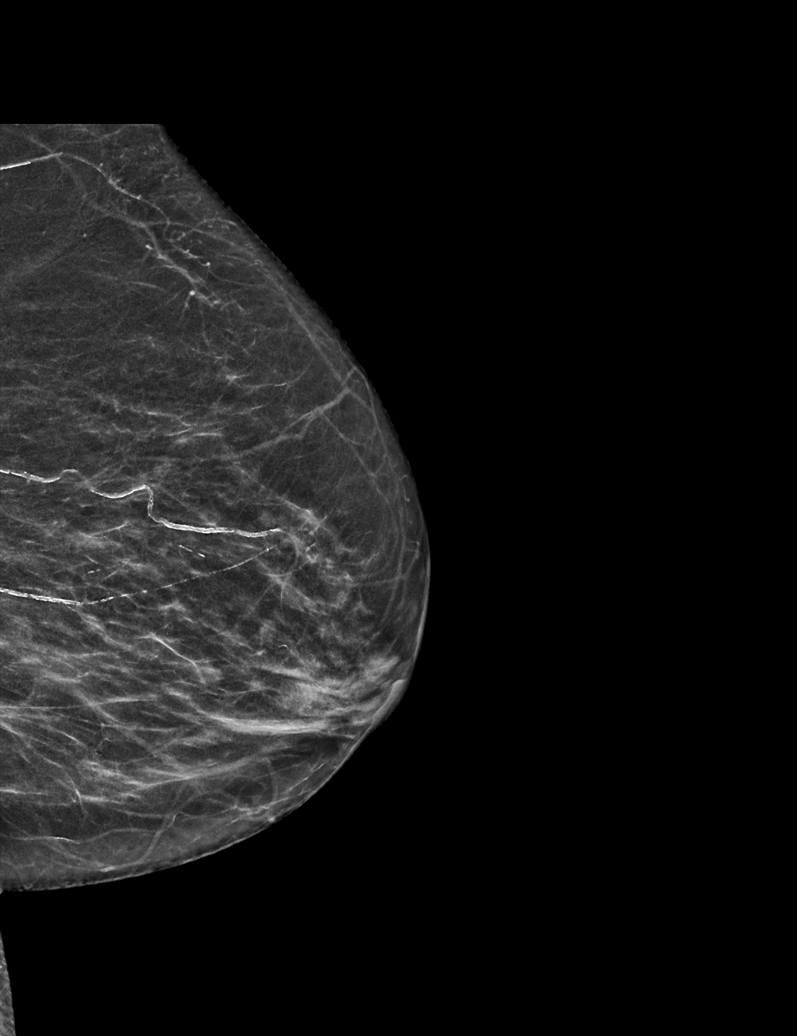

[L MLO synth-2D (2 of 2)]
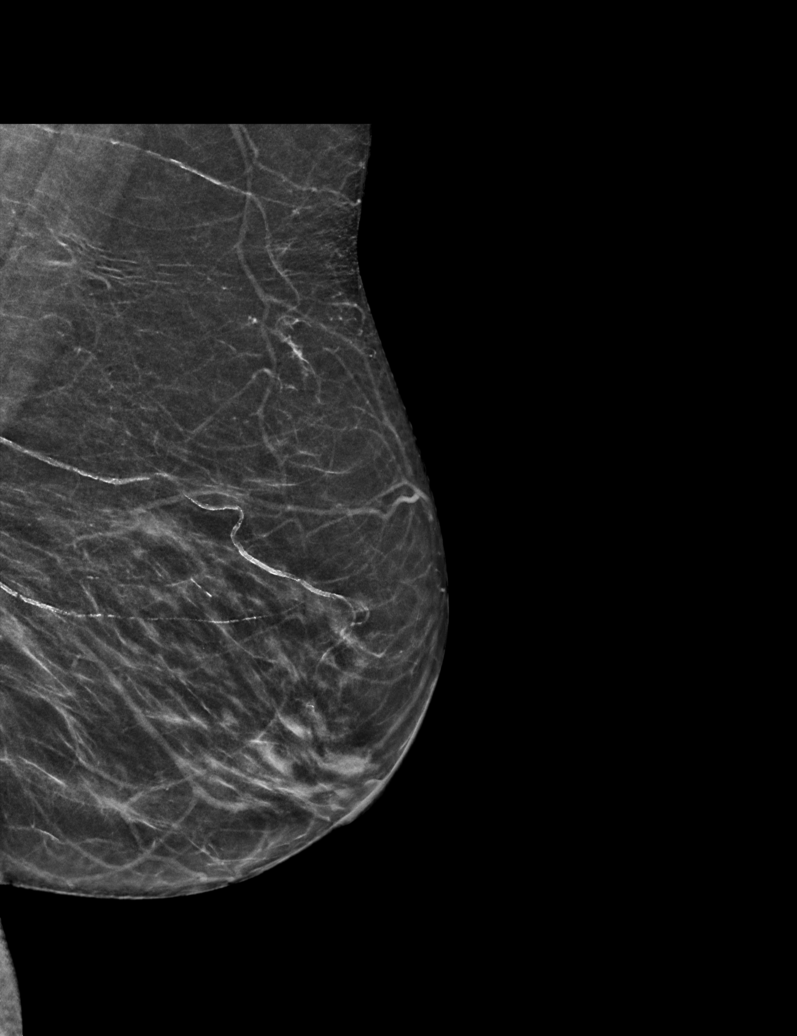

[R CC synth-2D]
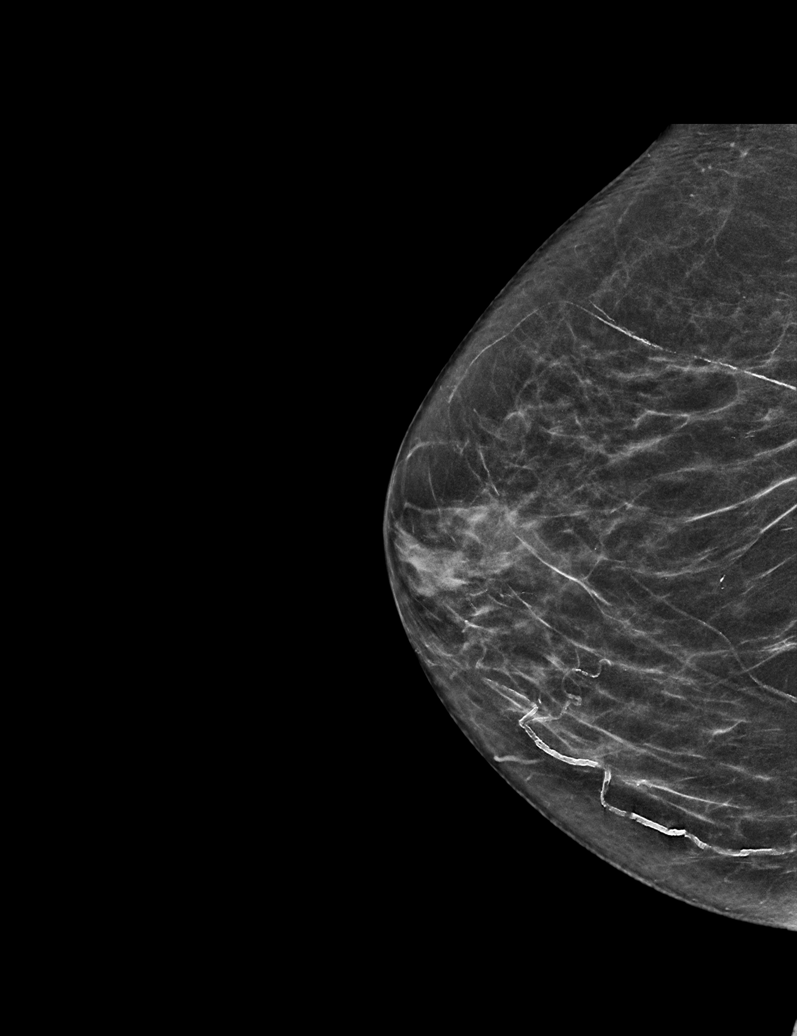

[6 of 36 positions shown; findings below may reference images not displayed]

ACR Breast Density Category b: There are scattered areas of
fibroglandular density.
FINDINGS: There are no findings suspicious for malignancy.
IMPRESSION: No mammographic evidence of malignancy. A result letter of this
screening mammogram will be mailed directly to the patient.

RECOMMENDATION:
Screening mammogram in one year. (Code:51-O-LD2)

BI-RADS CATEGORY  1: Negative.

## 2023-07-03 ENCOUNTER — Other Ambulatory Visit: Payer: Self-pay | Admitting: Internal Medicine

## 2023-07-03 DIAGNOSIS — Z1231 Encounter for screening mammogram for malignant neoplasm of breast: Secondary | ICD-10-CM

## 2023-08-06 ENCOUNTER — Ambulatory Visit
Admission: RE | Admit: 2023-08-06 | Discharge: 2023-08-06 | Disposition: A | Discharge status: Home / Self Care | Source: Ambulatory Visit | Attending: Internal Medicine | Admitting: Internal Medicine

## 2023-08-06 DIAGNOSIS — Z1231 Encounter for screening mammogram for malignant neoplasm of breast: Secondary | ICD-10-CM

## 2024-02-05 ENCOUNTER — Ambulatory Visit: Admitting: Orthopedic Surgery

## 2024-02-05 ENCOUNTER — Other Ambulatory Visit (INDEPENDENT_AMBULATORY_CARE_PROVIDER_SITE_OTHER)

## 2024-02-05 VITALS — BP 129/77 | HR 75 | Ht 63.25 in | Wt 143.6 lb

## 2024-02-05 DIAGNOSIS — M545 Low back pain, unspecified: Secondary | ICD-10-CM

## 2024-02-05 NOTE — Progress Notes (Signed)
 Orthopedic Spine Surgery Office Note  Assessment: Patient is a 84 y.o. female with chronic, progressive low back pain.  No radicular symptoms   Plan: -Explained that initially conservative treatment is tried as a significant number of patients may experience relief with these treatment modalities. Discussed that the conservative treatments include:  -activity modification  -physical therapy  -over the counter pain medications  -medrol dosepak  -steroid injections -Patient has tried tylenol , meloxicam , tramadol. Told her she can use tylenol  up to 1000mg  TID -Recommended diagnostic/therapeutic L4/5 and L5/S1 facet injections.  Referral provided to her today.  If she does well with those, could consider RFA in the future -Patient should return to office in 6 weeks, x-rays at next visit: none   Patient expressed understanding of the plan and all questions were answered to the patient's satisfaction.   ___________________________________________________________________________   History:  Patient is a 84 y.o. female who presents today for lumbar spine.  Patient has had approximately 3 years of low back pain.  She feels it in the lower lumbar region around the belt line.  She has no pain radiating to either lower extremity.  She notes it is worse if she is standing or walking for longer than 10 minutes.  It is also worse if she is sitting with her back straight for a long time.  Generally gets better if she leans forward.  She does not notice it getting better if she leans forward while standing just while sitting.  There was no trauma or injury that preceded the onset of the pain.  Patient rates the pain as a 7 out of 10 at its worst.   Weakness: Denies  Symptoms of imbalance: Denies  Paresthesias and numbness: Denies  Bowel or bladder incontinence: Denies  Saddle anesthesia: Denies   Treatments tried: tylenol , meloxicam , tramadol  Review of systems: Denies fevers and chills, night  sweats, unexplained weight loss, history of cancer, pain that wakes her at night  Past medical history: HTN HLD GERD IBS COPD Chronic pain  Allergies: NKDA  Past surgical history:  Left THA Hysterectomy  Social history: Denies use of nicotine product (smoking, vaping, patches, smokeless) Alcohol use: denies Denies recreational drug use   Physical Exam:  BMI of 25.2  General: no acute distress, appears stated age Neurologic: alert, answering questions appropriately, following commands Respiratory: unlabored breathing on room air, symmetric chest rise Psychiatric: appropriate affect, normal cadence to speech   MSK (spine):  -Strength exam      Left  Right EHL    5/5  5/5 TA    5/5  5/5 GSC    5/5  5/5 Knee extension  5/5  5/5 Hip flexion   5/5  5/5  -Sensory exam    Sensation intact to light touch in L3-S1 nerve distributions of bilateral lower extremities  -Achilles DTR: 1/4 on the left, 1/4 on the right -Patellar tendon DTR: 1/4 on the left, 1/4 on the right  -Straight leg raise: negative bilaterally -Clonus: no beats bilaterally  -Left hip exam: no pain through range of motion -Right hip exam: positive FADIR, positive Stinchfield, negative SI joint compression test, negative FABER  Imaging: XRs of the lumbar spine from 02/05/2024 were independently reviewed and interpreted, showing joint space narrowing in the right hip joint.  Total hip arthroplasty seen in the left hip.  Disc height loss at L4/5 with small anterior osteophyte formation seen.  No evidence of instability on flexion/extension views.  No fracture or dislocation seen.  Patient name: Kathryn Gomez Patient MRN: [id number] Date of visit: 02/05/2024

## 2024-02-08 ENCOUNTER — Telehealth: Payer: Self-pay

## 2024-02-08 NOTE — Telephone Encounter (Signed)
 Pre procedural Valium

## 2024-02-11 ENCOUNTER — Other Ambulatory Visit: Payer: Self-pay | Admitting: Physical Medicine and Rehabilitation

## 2024-02-11 DIAGNOSIS — G8929 Other chronic pain: Secondary | ICD-10-CM

## 2024-02-11 MED ORDER — DIAZEPAM 5 MG PO TABS: ORAL_TABLET | ORAL | 0 refills | Status: AC

## 2024-02-20 ENCOUNTER — Telehealth: Payer: Self-pay | Admitting: Physical Medicine and Rehabilitation

## 2024-02-20 NOTE — Telephone Encounter (Signed)
 Carena from [employer] prior authorization dept called wanting to know the pt's diagnosis for the Diazepam . Call back number is [phone]. Case number is [id number].

## 2024-02-26 ENCOUNTER — Ambulatory Visit: Admitting: Physical Medicine and Rehabilitation

## 2024-02-26 ENCOUNTER — Other Ambulatory Visit: Payer: Self-pay

## 2024-02-26 VITALS — BP 133/78

## 2024-02-26 DIAGNOSIS — M47816 Spondylosis without myelopathy or radiculopathy, lumbar region: Secondary | ICD-10-CM | POA: Diagnosis not present

## 2024-02-26 MED ORDER — METHYLPREDNISOLONE ACETATE 40 MG/ML IJ SUSP
40.0000 mg | Freq: Once | INTRAMUSCULAR | Status: AC
  Administered 2024-02-26: 40 mg

## 2024-02-26 NOTE — Procedures (Signed)
 Lumbar Facet Joint Intra-Articular Injection(s) with Fluoroscopic Guidance  Patient: Kathryn Gomez      Date of Birth: 04-Jun-1940 MRN: [id number] PCP: Henry Ingle, MD      Visit Date: 02/26/2024   Universal Protocol:    Date/Time: 02/26/2024  Consent Given By: the patient  Position: PRONE   Additional Comments: Vital signs were monitored before and after the procedure. Patient was prepped and draped in the usual sterile fashion. The correct patient, procedure, and site was verified.   Injection Procedure Details:  Procedure Site One Meds Administered:  Meds ordered this encounter  Medications   methylPREDNISolone  acetate (DEPO-MEDROL ) injection 40 mg     Laterality: Bilateral  Location/Site:  L4-L5 L5-S1  Needle size: 22 guage  Needle type: Spinal  Needle Placement: Articular  Findings:  -Comments: Excellent flow of contrast producing a partial arthrogram.  Procedure Details: The fluoroscope beam is vertically oriented in AP, and the inferior recess is visualized beneath the lower pole of the inferior apophyseal process, which represents the target point for needle insertion. When direct visualization is difficult the target point is located at the medial projection of the vertebral pedicle. The region overlying each aforementioned target is locally anesthetized with a 1 to 2 ml. volume of 1% Lidocaine without Epinephrine.   The spinal needle was inserted into each of the above mentioned facet joints using biplanar fluoroscopic guidance. A 0.25 to 0.5 ml. volume of Isovue-250 was injected and a partial facet joint arthrogram was obtained. A single spot film was obtained of the resulting arthrogram.    One to 1.25 ml of the steroid/anesthetic solution was then injected into each of the facet joints noted above.   Additional Comments:  The patient tolerated the procedure well Dressing: 2 x 2 sterile gauze and Band-Aid    Post-procedure details: Patient was  observed during the procedure. Post-procedure instructions were reviewed.  Patient left the clinic in stable condition.

## 2024-02-26 NOTE — Progress Notes (Signed)
 "  Kathryn Gomez - 84 y.o. female MRN [id number]  Date of birth: [birth year]  Office Visit Note: Visit Date: 02/26/2024 PCP: Henry Ingle, MD Referred by: Henry Ingle, MD  Subjective: Chief Complaint  Patient presents with   Lower Back - Pain   HPI:  Kathryn Gomez is a 84 y.o. female who comes in today at the request of Dr. Ozell Ada for planned Bilateral  L4-5 and L5-S1 Lumbar facet/medial branch block with fluoroscopic guidance.  The patient has failed conservative care including home exercise, medications, time and activity modification.  This injection will be diagnostic and hopefully therapeutic.  Please see requesting physician notes for further details and justification.  Exam has shown concordant pain with facet joint loading.   ROS Otherwise per HPI.  Assessment & Plan: Visit Diagnoses:    ICD-10-CM   1. Spondylosis without myelopathy or radiculopathy, lumbar region  M47.816 XR C-ARM NO REPORT    Facet Injection    methylPREDNISolone  acetate (DEPO-MEDROL ) injection 40 mg      Plan: No additional findings.   Meds & Orders:  Meds ordered this encounter  Medications   methylPREDNISolone  acetate (DEPO-MEDROL ) injection 40 mg    Orders Placed This Encounter  Procedures   Facet Injection   XR C-ARM NO REPORT    Follow-up: Return for visit to requesting provider as needed.   Procedures: No procedures performed  Lumbar Facet Joint Intra-Articular Injection(s) with Fluoroscopic Guidance  Patient: Kathryn Gomez      Date of Birth: 08-21-1940 MRN: [id number] PCP: Henry Ingle, MD      Visit Date: 02/26/2024   Universal Protocol:    Date/Time: 02/26/2024  Consent Given By: the patient  Position: PRONE   Additional Comments: Vital signs were monitored before and after the procedure. Patient was prepped and draped in the usual sterile fashion. The correct patient, procedure, and site was verified.   Injection Procedure Details:  Procedure Site  One Meds Administered:  Meds ordered this encounter  Medications   methylPREDNISolone  acetate (DEPO-MEDROL ) injection 40 mg     Laterality: Bilateral  Location/Site:  L4-L5 L5-S1  Needle size: 22 guage  Needle type: Spinal  Needle Placement: Articular  Findings:  -Comments: Excellent flow of contrast producing a partial arthrogram.  Procedure Details: The fluoroscope beam is vertically oriented in AP, and the inferior recess is visualized beneath the lower pole of the inferior apophyseal process, which represents the target point for needle insertion. When direct visualization is difficult the target point is located at the medial projection of the vertebral pedicle. The region overlying each aforementioned target is locally anesthetized with a 1 to 2 ml. volume of 1% Lidocaine without Epinephrine.   The spinal needle was inserted into each of the above mentioned facet joints using biplanar fluoroscopic guidance. A 0.25 to 0.5 ml. volume of Isovue-250 was injected and a partial facet joint arthrogram was obtained. A single spot film was obtained of the resulting arthrogram.    One to 1.25 ml of the steroid/anesthetic solution was then injected into each of the facet joints noted above.   Additional Comments:  The patient tolerated the procedure well Dressing: 2 x 2 sterile gauze and Band-Aid    Post-procedure details: Patient was observed during the procedure. Post-procedure instructions were reviewed.  Patient left the clinic in stable condition.    Clinical History: XRs of the lumbar spine from 02/05/2024 were independently reviewed and interpreted, showing joint space narrowing in the right hip joint.  Total hip arthroplasty seen in the left hip.  Disc height loss at L4/5 with small anterior osteophyte formation seen.  No evidence of instability on flexion/extension views.  No fracture or dislocation seen.     Objective:  VS:  HT:    WT:   BMI:     BP:133/78  HR:  bpm  TEMP: ( )  RESP:  Physical Exam Vitals and nursing note reviewed.  Constitutional:      General: She is not in acute distress.    Appearance: Normal appearance. She is not ill-appearing.  HENT:     Head: Normocephalic and atraumatic.     Right Ear: External ear normal.     Left Ear: External ear normal.  Eyes:     Extraocular Movements: Extraocular movements intact.  Cardiovascular:     Rate and Rhythm: Normal rate.     Pulses: Normal pulses.  Pulmonary:     Effort: Pulmonary effort is normal. No respiratory distress.  Abdominal:     General: There is no distension.     Palpations: Abdomen is soft.  Musculoskeletal:        General: Tenderness present.     Cervical back: Neck supple.     Right lower leg: No edema.     Left lower leg: No edema.     Comments: Patient has good distal strength with no pain over the greater trochanters.  No clonus or focal weakness.  Skin:    Findings: No erythema, lesion or rash.  Neurological:     General: No focal deficit present.     Mental Status: She is alert and oriented to person, place, and time.     Sensory: No sensory deficit.     Motor: No weakness or abnormal muscle tone.     Coordination: Coordination normal.  Psychiatric:        Mood and Affect: Mood normal.        Behavior: Behavior normal.      Imaging: No results found. "

## 2024-02-26 NOTE — Progress Notes (Signed)
 Pain Scale   Average Pain 6 Patient advising she has chronic lower back pain that increases when walking and standing pain is constant.        +Driver, -BT, -Dye Allergies.

## 2024-03-19 ENCOUNTER — Ambulatory Visit: Admitting: Orthopedic Surgery
